# Patient Record
Sex: Male | Born: 1945 | Race: Black or African American | Hispanic: No | Marital: Single | Smoking: Former smoker
Health system: Southern US, Community
[De-identification: ages and names within clinical notes are randomized; demographics above are authoritative.]

## PROBLEM LIST (undated history)

## (undated) DIAGNOSIS — J449 Chronic obstructive pulmonary disease, unspecified: Secondary | ICD-10-CM

## (undated) HISTORY — PX: CATARACT EXTRACTION: SUR2

---

## 2011-04-18 ENCOUNTER — Emergency Department (HOSPITAL_COMMUNITY): Payer: Self-pay

## 2011-04-18 ENCOUNTER — Emergency Department (HOSPITAL_COMMUNITY)
Admission: EM | Admit: 2011-04-18 | Discharge: 2011-04-18 | Disposition: A | Discharge status: Home / Self Care | Payer: Self-pay | Attending: Emergency Medicine | Admitting: Emergency Medicine

## 2011-04-18 ENCOUNTER — Encounter: Payer: Self-pay | Admitting: *Deleted

## 2011-04-18 ENCOUNTER — Other Ambulatory Visit: Payer: Self-pay

## 2011-04-18 DIAGNOSIS — R0602 Shortness of breath: Secondary | ICD-10-CM | POA: Insufficient documentation

## 2011-04-18 DIAGNOSIS — R062 Wheezing: Secondary | ICD-10-CM

## 2011-04-18 DIAGNOSIS — J45909 Unspecified asthma, uncomplicated: Secondary | ICD-10-CM | POA: Insufficient documentation

## 2011-04-18 LAB — GLUCOSE, CAPILLARY: Glucose-Capillary: 84 mg/dL (ref 70–99)

## 2011-04-18 MED ORDER — ALBUTEROL SULFATE (5 MG/ML) 0.5% IN NEBU
2.5000 mg | INHALATION_SOLUTION | Freq: Once | RESPIRATORY_TRACT | Status: AC
  Administered 2011-04-18: 2.5 mg via RESPIRATORY_TRACT
  Filled 2011-04-18: qty 1

## 2011-04-18 MED ORDER — AEROCHAMBER PLUS W/MASK MISC
Status: AC
  Administered 2011-04-18: 17:00:00
  Filled 2011-04-18: qty 1

## 2011-04-18 MED ORDER — PREDNISONE 20 MG PO TABS: 60.0000 mg | ORAL_TABLET | Freq: Every day | ORAL | Status: AC

## 2011-04-18 MED ORDER — AEROCHAMBER PLUS W/MASK MISC
Status: AC
  Filled 2011-04-18: qty 1

## 2011-04-18 MED ORDER — IPRATROPIUM BROMIDE 0.02 % IN SOLN
0.5000 mg | Freq: Once | RESPIRATORY_TRACT | Status: AC
  Administered 2011-04-18: 0.5 mg via RESPIRATORY_TRACT
  Filled 2011-04-18 (×2): qty 2.5

## 2011-04-18 MED ORDER — PREDNISONE 20 MG PO TABS
50.0000 mg | ORAL_TABLET | Freq: Once | ORAL | Status: AC
  Administered 2011-04-18: 50 mg via ORAL
  Filled 2011-04-18: qty 3

## 2011-04-18 MED ORDER — ALBUTEROL SULFATE HFA 108 (90 BASE) MCG/ACT IN AERS
4.0000 | INHALATION_SPRAY | Freq: Once | RESPIRATORY_TRACT | Status: AC
  Administered 2011-04-18: 4 via RESPIRATORY_TRACT
  Filled 2011-04-18 (×2): qty 6.7

## 2011-04-18 MED ORDER — ALBUTEROL SULFATE HFA 108 (90 BASE) MCG/ACT IN AERS: 4.0000 | INHALATION_SPRAY | RESPIRATORY_TRACT | Status: DC

## 2011-04-18 NOTE — Progress Notes (Signed)
65 year old male comes in because of an episode of dyspnea which woke him up this morning. He has problems with dyspnea intermittently and has had problems for a long time. He states it seems to be getting worse. When he has a flare up, he is unable to laydown, unable to walk, unable to sleep. He has chest tightness, but no chest pain, no coughing, no vomiting, no diaphoresis. I examined him after he had already received one albuterol nebulizer treatment, and he still is having inspiratory and expiratory wheezing. He will need additional albuterol, and will need chest x-ray and ECG since he states he has not seen a physician for approximately 30 years.

## 2011-04-18 NOTE — ED Notes (Signed)
To ed for eval of sob. States he has a hx of asthma but not under the care of a md. States he has had intermittent sob but it seems to resolve on its own. Pt speaking phrases but with breaks in sentences for breath. Skin w/d. Denies fevers.

## 2011-04-18 NOTE — ED Provider Notes (Signed)
History     CSN: [id number] Arrival date & time: 04/18/2011  2:15 PM   First MD Initiated Contact with Patient 04/18/11 1456      Chief Complaint  Patient presents with  . Shortness of Breath    (Consider location/radiation/quality/duration/timing/severity/associated sxs/prior treatment) Patient is a 65 y.o. male presenting with shortness of breath. The history is provided by the patient.  Shortness of Breath  The current episode started yesterday. The problem occurs occasionally. The problem has been gradually worsening. The problem is moderate. The symptoms are relieved by rest (used to be relieved by rest, but has not been today). Associated symptoms include shortness of breath and wheezing. Pertinent negatives include no chest pain, no fever and no cough. There was no intake of a foreign body. He has had no prior steroid use. He has had prior intubations (pt states that he had worse episode in the past and passed out; waking up intubated). He has received no recent medical care.    Past Medical History  Diagnosis Date  . Asthma     History reviewed. No pertinent past surgical history.  History reviewed. No pertinent family history.  History  Substance Use Topics  . Smoking status: Not on file  . Smokeless tobacco: Not on file  . Alcohol Use: No      Review of Systems  Constitutional: Negative for fever.  Respiratory: Positive for shortness of breath and wheezing. Negative for cough.   Cardiovascular: Negative for chest pain.  Gastrointestinal: Negative for nausea, vomiting, abdominal pain and diarrhea.  Genitourinary: Positive for frequency.  Neurological: Negative for headaches.  All other systems reviewed and are negative.    Allergies  Review of patient's allergies indicates no known allergies.  Home Medications  No current outpatient prescriptions on file.  BP 173/104  Pulse 95  Temp(Src) 98.5 F (36.9 C) (Oral)  Resp 22  SpO2 97%  Physical Exam    Nursing note and vitals reviewed. Constitutional: He is oriented to person, place, and time. He appears well-developed and well-nourished. He appears distressed.  HENT:  Head: Normocephalic and atraumatic.  Eyes: Pupils are equal, round, and reactive to light.  Cardiovascular: Normal rate and normal heart sounds.   Pulmonary/Chest: Accessory muscle usage present. He is in respiratory distress (talking in short phrases). He has wheezes (both inspiratory and expiratory) in the right upper field, the right middle field, the right lower field, the left upper field, the left middle field and the left lower field.  Abdominal: Soft. He exhibits no distension. There is no tenderness.  Musculoskeletal: Normal range of motion.  Neurological: He is alert and oriented to person, place, and time.  Skin: Skin is warm and dry.  Psychiatric: He has a normal mood and affect.    ED Course  Procedures (including critical care time)  Dg Chest 2 View  04/18/2011  *RADIOLOGY REPORT*  Clinical Data: Wheezing and hypoxia  CHEST - 2 VIEW  Comparison: None.  Findings: Cardiomediastinal silhouette appears normal. Hyperexpansion of the lungs is noted consistent with chronic obstructive pulmonary disease.  No acute pulmonary abnormality is seen.  IMPRESSION: No acute cardiopulmonary abnormality seen.  Original Report Authenticated By: Venita Sheffield., M.D.   Results for orders placed during the hospital encounter of 04/18/11  GLUCOSE, CAPILLARY      Component Value Range   Glucose-Capillary 84  70 - 99 (mg/dL)   Comment 1 Notify RN     Comment 2 Documented in Chart  Date: 04/18/2011  Rate: 88  Rhythm: normal sinus rhythm  QRS Axis: normal  Intervals: normal  ST/T Wave abnormalities: normal  Conduction Disutrbances:none  Narrative Interpretation:   Old EKG Reviewed: none available    1. Wheezing       MDM  3:08 PM Pt seen and examined. Pt with history of childhood asthma has been without  medication since that time. Patient with intermittent episodes of SOB. This one has been going on for several days. Patient has not tried albuterol since childhood, instead using herbal supplements. Will start breathing treatments and give steroids. As patient has not seen doctor for long period of time and is growing older, will also check EKG (ACS could be another cause of shortness of breath), CXR (to ensure no pulmonary edema as cause for diuresis) and CBG (concern for DM given increased urination).   4:31 PM On re-exam, patient able to talk in long sentences and diffuse wheezing as resolved. Patient states he feels much improved. Will advise continued use of albuterol and prednisone for the next several days. Labs, CXR, and EKG unremarkable. Will treat as episode of wheezing and advise [facility].  Daleen Bo 04/19/11 [id number]

## 2011-04-20 NOTE — ED Provider Notes (Signed)
I saw and evaluated the patient, reviewed the resident's note and I agree with the findings and plan. Please see separate progress note for details of my personal interaction with the patient. I have reviewed the resident's interpretation of the ECG, and agree with it.  Dione Booze, MD 04/20/11 [phone]

## 2011-07-21 ENCOUNTER — Emergency Department (HOSPITAL_COMMUNITY)
Admission: EM | Admit: 2011-07-21 | Discharge: 2011-07-21 | Disposition: A | Discharge status: Home / Self Care | Payer: Self-pay | Attending: Emergency Medicine | Admitting: Emergency Medicine

## 2011-07-21 ENCOUNTER — Encounter (HOSPITAL_COMMUNITY): Payer: Self-pay | Admitting: Emergency Medicine

## 2011-07-21 ENCOUNTER — Emergency Department (HOSPITAL_COMMUNITY): Payer: Self-pay

## 2011-07-21 DIAGNOSIS — J45909 Unspecified asthma, uncomplicated: Secondary | ICD-10-CM | POA: Insufficient documentation

## 2011-07-21 DIAGNOSIS — R0602 Shortness of breath: Secondary | ICD-10-CM | POA: Insufficient documentation

## 2011-07-21 DIAGNOSIS — J9801 Acute bronchospasm: Secondary | ICD-10-CM

## 2011-07-21 DIAGNOSIS — R05 Cough: Secondary | ICD-10-CM | POA: Insufficient documentation

## 2011-07-21 DIAGNOSIS — R059 Cough, unspecified: Secondary | ICD-10-CM | POA: Insufficient documentation

## 2011-07-21 MED ORDER — ALBUTEROL SULFATE HFA 108 (90 BASE) MCG/ACT IN AERS: 2.0000 | INHALATION_SPRAY | RESPIRATORY_TRACT | Status: DC | PRN

## 2011-07-21 MED ORDER — PREDNISONE 10 MG PO TABS: 20.0000 mg | ORAL_TABLET | Freq: Every day | ORAL | Status: DC

## 2011-07-21 MED ORDER — IPRATROPIUM BROMIDE 0.02 % IN SOLN
0.5000 mg | Freq: Once | RESPIRATORY_TRACT | Status: AC
  Administered 2011-07-21: 0.5 mg via RESPIRATORY_TRACT
  Filled 2011-07-21: qty 2.5

## 2011-07-21 MED ORDER — ALBUTEROL SULFATE (5 MG/ML) 0.5% IN NEBU
5.0000 mg | INHALATION_SOLUTION | Freq: Once | RESPIRATORY_TRACT | Status: AC
  Administered 2011-07-21: 5 mg via RESPIRATORY_TRACT
  Filled 2011-07-21: qty 1

## 2011-07-21 MED ORDER — PREDNISONE 20 MG PO TABS
60.0000 mg | ORAL_TABLET | Freq: Once | ORAL | Status: AC
  Administered 2011-07-21: 60 mg via ORAL
  Filled 2011-07-21: qty 3

## 2011-07-21 MED ORDER — ALBUTEROL SULFATE HFA 108 (90 BASE) MCG/ACT IN AERS
2.0000 | INHALATION_SPRAY | RESPIRATORY_TRACT | Status: DC | PRN
  Administered 2011-07-21: 2 via RESPIRATORY_TRACT
  Filled 2011-07-21: qty 6.7

## 2011-07-21 NOTE — ED Provider Notes (Signed)
History     CSN: [id number]  Arrival date & time 07/21/11  1327   First MD Initiated Contact with Patient 07/21/11 1407      Chief Complaint  Patient presents with  . Shortness of Breath    (Consider location/radiation/quality/duration/timing/severity/associated sxs/prior treatment) Patient is a 66 y.o. male presenting with shortness of breath. The history is provided by the patient (The patient complains of shortness of breath and wheezing.). No language interpreter was used.  Shortness of Breath  The current episode started today. The problem occurs rarely. The problem has been unchanged. The problem is mild. The symptoms are relieved by nothing. The symptoms are aggravated by nothing. Associated symptoms include shortness of breath and wheezing. Pertinent negatives include no chest pain and no cough. The cough has no precipitants. The cough is non-productive. Nothing relieves the cough. Nothing worsens the cough. He has had intermittent steroid use.    Past Medical History  Diagnosis Date  . Asthma     History reviewed. No pertinent past surgical history.  No family history on file.  History  Substance Use Topics  . Smoking status: Former Games developer  . Smokeless tobacco: Not on file  . Alcohol Use: No      Review of Systems  Constitutional: Negative for fatigue.  HENT: Negative for congestion, sinus pressure and ear discharge.   Eyes: Negative for discharge.  Respiratory: Positive for shortness of breath and wheezing. Negative for cough.   Cardiovascular: Negative for chest pain.  Gastrointestinal: Negative for abdominal pain and diarrhea.  Genitourinary: Negative for frequency and hematuria.  Musculoskeletal: Negative for back pain.  Skin: Negative for rash.  Neurological: Negative for seizures and headaches.  Hematological: Negative.   Psychiatric/Behavioral: Negative for hallucinations.    Allergies  Review of patient's allergies indicates no known  allergies.  Home Medications   Current Outpatient Rx  Name Route Sig Dispense Refill  . ALBUTEROL SULFATE HFA 108 (90 BASE) MCG/ACT IN AERS Inhalation Inhale 2 puffs into the lungs every 4 (four) hours as needed for wheezing. 1 Inhaler 0  . PREDNISONE 10 MG PO TABS Oral Take 2 tablets (20 mg total) by mouth daily. 10 tablet 0    BP 113/79  Pulse 110  Temp(Src) 98.1 F (36.7 C) (Oral)  Resp 16  SpO2 100%  Physical Exam  Constitutional: He is oriented to person, place, and time. He appears well-developed.  HENT:  Head: Normocephalic and atraumatic.  Eyes: Conjunctivae and EOM are normal. No scleral icterus.  Neck: Neck supple. No thyromegaly present.  Cardiovascular: Normal rate and regular rhythm.  Exam reveals no gallop and no friction rub.   No murmur heard. Pulmonary/Chest: No stridor. He has wheezes. He has no rales. He exhibits no tenderness.  Abdominal: He exhibits no distension. There is no tenderness. There is no rebound.  Musculoskeletal: Normal range of motion. He exhibits no edema.  Lymphadenopathy:    He has no cervical adenopathy.  Neurological: He is oriented to person, place, and time. Coordination normal.  Skin: No rash noted. No erythema.  Psychiatric: He has a normal mood and affect. His behavior is normal.    ED Course  Procedures (including critical care time)  Labs Reviewed - No data to display Dg Chest 2 View  07/21/2011  *RADIOLOGY REPORT*  Clinical Data: Chest pain, shortness of breath.  CHEST - 2 VIEW  Comparison: 04/18/2011  Findings: There is hyperinflation of the lungs compatible with COPD.  Heart and mediastinal contours are within normal  limits.  No focal opacities or effusions.  No acute bony abnormality.  IMPRESSION: COPD.  No active disease.  Original Report Authenticated By: Cyndie Chime, M.D.     1. Bronchospasm       MDM  Bronchospasm.  Pt improved with tx        Benny Lennert, MD 07/21/11 1630

## 2011-07-21 NOTE — ED Notes (Signed)
Shortness of breath for  A couple of months ran out of meds did not get meds filled from last visit

## 2013-07-30 ENCOUNTER — Encounter (HOSPITAL_COMMUNITY): Payer: Self-pay | Admitting: Emergency Medicine

## 2013-07-30 ENCOUNTER — Emergency Department (HOSPITAL_COMMUNITY)
Admission: EM | Admit: 2013-07-30 | Discharge: 2013-07-30 | Disposition: A | Discharge status: Home / Self Care | Payer: Medicare Other | Attending: Emergency Medicine | Admitting: Emergency Medicine

## 2013-07-30 ENCOUNTER — Emergency Department (HOSPITAL_COMMUNITY): Payer: Medicare Other

## 2013-07-30 DIAGNOSIS — J4489 Other specified chronic obstructive pulmonary disease: Secondary | ICD-10-CM | POA: Diagnosis not present

## 2013-07-30 DIAGNOSIS — IMO0002 Reserved for concepts with insufficient information to code with codable children: Secondary | ICD-10-CM | POA: Diagnosis not present

## 2013-07-30 DIAGNOSIS — J441 Chronic obstructive pulmonary disease with (acute) exacerbation: Secondary | ICD-10-CM | POA: Diagnosis not present

## 2013-07-30 DIAGNOSIS — Z87891 Personal history of nicotine dependence: Secondary | ICD-10-CM | POA: Diagnosis not present

## 2013-07-30 DIAGNOSIS — I498 Other specified cardiac arrhythmias: Secondary | ICD-10-CM | POA: Diagnosis not present

## 2013-07-30 DIAGNOSIS — J45901 Unspecified asthma with (acute) exacerbation: Principal | ICD-10-CM

## 2013-07-30 LAB — CBC WITH DIFFERENTIAL/PLATELET
Basophils Absolute: 0 10*3/uL (ref 0.0–0.1)
Basophils Relative: 0 % (ref 0–1)
EOS PCT: 1 % (ref 0–5)
Eosinophils Absolute: 0.1 10*3/uL (ref 0.0–0.7)
HEMATOCRIT: 41.3 % (ref 39.0–52.0)
HEMOGLOBIN: 14.6 g/dL (ref 13.0–17.0)
LYMPHS ABS: 1.3 10*3/uL (ref 0.7–4.0)
LYMPHS PCT: 16 % (ref 12–46)
MCH: 29.9 pg (ref 26.0–34.0)
MCHC: 35.4 g/dL (ref 30.0–36.0)
MCV: 84.6 fL (ref 78.0–100.0)
MONO ABS: 0.4 10*3/uL (ref 0.1–1.0)
MONOS PCT: 5 % (ref 3–12)
Neutro Abs: 6.3 10*3/uL (ref 1.7–7.7)
Neutrophils Relative %: 78 % — ABNORMAL HIGH (ref 43–77)
Platelets: 180 10*3/uL (ref 150–400)
RBC: 4.88 MIL/uL (ref 4.22–5.81)
RDW: 13.5 % (ref 11.5–15.5)
WBC: 8.1 10*3/uL (ref 4.0–10.5)

## 2013-07-30 LAB — COMPREHENSIVE METABOLIC PANEL
ALT: 47 U/L (ref 0–53)
AST: 67 U/L — ABNORMAL HIGH (ref 0–37)
Albumin: 3.6 g/dL (ref 3.5–5.2)
Alkaline Phosphatase: 122 U/L — ABNORMAL HIGH (ref 39–117)
BUN: 12 mg/dL (ref 6–23)
CALCIUM: 9.6 mg/dL (ref 8.4–10.5)
CO2: 26 meq/L (ref 19–32)
CREATININE: 0.75 mg/dL (ref 0.50–1.35)
Chloride: 102 mEq/L (ref 96–112)
GLUCOSE: 110 mg/dL — AB (ref 70–99)
Potassium: 3.7 mEq/L (ref 3.7–5.3)
SODIUM: 143 meq/L (ref 137–147)
Total Bilirubin: 0.7 mg/dL (ref 0.3–1.2)
Total Protein: 8.1 g/dL (ref 6.0–8.3)

## 2013-07-30 LAB — PRO B NATRIURETIC PEPTIDE: Pro B Natriuretic peptide (BNP): 1213 pg/mL — ABNORMAL HIGH (ref 0–125)

## 2013-07-30 MED ORDER — ALBUTEROL SULFATE (2.5 MG/3ML) 0.083% IN NEBU: 2.5000 mg | INHALATION_SOLUTION | Freq: Once | RESPIRATORY_TRACT | Status: DC

## 2013-07-30 MED ORDER — ALBUTEROL SULFATE HFA 108 (90 BASE) MCG/ACT IN AERS
2.0000 | INHALATION_SPRAY | RESPIRATORY_TRACT | Status: DC | PRN
  Administered 2013-07-30: 2 via RESPIRATORY_TRACT
  Filled 2013-07-30: qty 6.7

## 2013-07-30 MED ORDER — IPRATROPIUM-ALBUTEROL 0.5-2.5 (3) MG/3ML IN SOLN
3.0000 mL | Freq: Once | RESPIRATORY_TRACT | Status: AC
  Administered 2013-07-30: 3 mL via RESPIRATORY_TRACT
  Filled 2013-07-30: qty 3

## 2013-07-30 MED ORDER — IPRATROPIUM BROMIDE 0.02 % IN SOLN
0.5000 mg | Freq: Once | RESPIRATORY_TRACT | Status: AC
  Administered 2013-07-30: 0.5 mg via RESPIRATORY_TRACT
  Filled 2013-07-30: qty 2.5

## 2013-07-30 MED ORDER — ALBUTEROL SULFATE (2.5 MG/3ML) 0.083% IN NEBU
5.0000 mg | INHALATION_SOLUTION | Freq: Once | RESPIRATORY_TRACT | Status: AC
  Administered 2013-07-30: 5 mg via RESPIRATORY_TRACT
  Filled 2013-07-30: qty 6

## 2013-07-30 MED ORDER — ALBUTEROL (5 MG/ML) CONTINUOUS INHALATION SOLN
15.0000 mg | INHALATION_SOLUTION | RESPIRATORY_TRACT | Status: DC
  Administered 2013-07-30: 15 mg via RESPIRATORY_TRACT
  Filled 2013-07-30: qty 20

## 2013-07-30 MED ORDER — METHYLPREDNISOLONE SODIUM SUCC 125 MG IJ SOLR
125.0000 mg | Freq: Once | INTRAMUSCULAR | Status: AC
  Administered 2013-07-30: 125 mg via INTRAVENOUS
  Filled 2013-07-30: qty 2

## 2013-07-30 MED ORDER — PREDNISONE 20 MG PO TABS: 40.0000 mg | ORAL_TABLET | Freq: Every day | ORAL | Status: DC

## 2013-07-30 NOTE — ED Notes (Signed)
Pt states that he has been SOB for the past couple of days. Pt states that he had asthma as a child and thought he grew out of it. Pt states no inhalers or nebs at home. Pt states cough as well for a couple of days.

## 2013-07-30 NOTE — ED Provider Notes (Signed)
Pt signed out to me by Marlon Peliffany Greene, PA-C at shift change.  Pt currently on 1hr neb tx.  Plan is to discharge home with steroids and rescue inhaler if feeling better. If not, pt will be admitted COPD exacerbation.  BNP is 1213, however CXR is clear.  4:35 PM Pt states he is feeling much better and feels comfortable being discharged home. Discussed tx with prednisone and rescue inhaler. Resource guide provided for PCP. Return precautions provided. Pt verbalized understanding and agreement with tx plan.    Junius Finnerrin O'Malley, PA-C 07/30/13 [phone]

## 2013-07-30 NOTE — Discharge Instructions (Signed)
°Emergency Department Resource Guide °1) Find a Doctor and Pay Out of Pocket °Although you won't have to find out who is covered by your insurance plan, it is a good idea to ask around and get recommendations. You will then need to call the office and see if the doctor you have chosen will accept you as a new patient and what types of options they offer for patients who are self-pay. Some doctors offer discounts or will set up payment plans for their patients who do not have insurance, but you will need to ask so you aren't surprised when you get to your appointment. ° °2) Contact Your Local Health Department °Not all health departments have doctors that can see patients for sick visits, but many do, so it is worth a call to see if yours does. If you don't know where your local health department is, you can check in your phone book. The CDC also has a tool to help you locate your state's health department, and many state websites also have listings of all of their local health departments. ° °3) Find a Walk-in Clinic °If your illness is not likely to be very severe or complicated, you may want to try a walk in clinic. These are popping up all over the country in pharmacies, drugstores, and shopping centers. They're usually staffed by nurse practitioners or physician assistants that have been trained to treat common illnesses and complaints. They're usually fairly quick and inexpensive. However, if you have serious medical issues or chronic medical problems, these are probably not your best option. ° °No Primary Care Doctor: °- Call [facility] Connect at  832-8000 - they can help you locate a primary care doctor that  accepts your insurance, provides certain services, etc. °- Physician Referral Service- 1-800-533-3463 ° °Chronic Pain Problems: °Organization         Address  Phone   Notes  °[facility]  (336) 297-2271 Patients need to be referred by their primary care doctor.  ° °Medication  Assistance: °Organization         Address  Phone   Notes  °[place] [facility] [address] °[place], [place] [address] (336) 641-8030 --Must be a resident of [place] °-- Must have NO insurance coverage whatsoever (no Medicaid/ Medicare, etc.) °-- The pt. MUST have a primary care doctor that directs their care regularly and follows them in the community °  °[facility]  (866) 331-1348   °[employer]  (888) 892-1162   ° °Agencies that provide inexpensive medical care: °Organization         Address  Phone   Notes  °[facility]  (336) 832-8035   ° Internal Medicine    (336) 832-7272   °[facility] [address] °[place], [place] [address] (336) 832-4777   °[facility] [address], °[place] (336) 271-4999   °[facility]    (336) 373-0678   °[facility]    (336) 272-1050   °[facility] ° [address], [place] Phone:  (336) 832-4444, Fax:  (336) 832-4440 Hours of Operation:  9 am - 6 pm, M-F.  Also accepts Medicaid/Medicare and self-pay.  °[facility] ° [address], [place] Phone: (336) 832-3150, Fax: (336) 832-3151. Hours of Operation:  8:30 am - 5:30 pm, M-F.  Also accepts Medicaid and self-pay.  °[facility] [place] [address], [place] Phone: (336) 878-6027   °[facility] [address], [place], [place] (336)723-1848, Ext. 123 Mondays & Thursdays: 7-9 AM.  First 15 patients are seen on a first come, first serve basis. °  ° °Medicaid-accepting [place] Providers: ° °Organization         Address  Phone   Notes  °[facility] [address], [place] (336) 641-2100 Also accepts self-pay patients.  °[facility] [address], [place] ° (336) 856-9996   °[facility] [address], [place]  (336) 288-8857   °[facility] [address] [place] [address], [place] (336) 299-7000   °Veita Bland [address], [place]  ° (336) 373-1557 Only accepts [place] Access Medicaid patients after they have their name applied to their card.  ° °Self-Pay (no insurance) in [place]: ° °Organization         Address  Phone   Notes  °Sickle Cell Patients, [facility] [address], [place] (336) 832-1970   °[facility] [address], [place] (336) 832-4400   °[facility] ° [address], [place] (336) 992-4800   °[facility]/Dr. Osei-Bonsu ° [address] [place] [address], [place] or [address], [place] (336) 841-8500 Phone number for both [place] and [place] locations is the same.  °[facility] [address], [place] (336) 299-0000   °[facility] [address] [place] [address], [place] or [address] (336) 852-7530 °(336) 878-2260   °[facility] [address], [place] (336) 350-1642, phone; (336) 294-5005, fax Sees patients 1st and 3rd Saturday of every month.  Must not qualify for public or private insurance (i.e. Medicaid, Medicare, [place] Health Choice, Veterans' Benefits) • Household income should be no more than 200% of the poverty level •The clinic cannot treat you if you are pregnant or think you are pregnant • Sexually transmitted diseases are not treated at the clinic.  ° ° °Dental Care: °Organization         Address  Phone  Notes  °[place] [facility] [address], [place] (336) 641-6152 Accepts children up to age 21 who are enrolled in Medicaid or [place] Health Choice; pregnant women with a Medicaid card; and children who have applied for Medicaid or [place] Health Choice, but were declined, whose parents can pay a reduced fee at time of service.  °[place]  [facility] [place]  [address], [place] (336) 641-7733 Accepts children up to age 21 who are enrolled in Medicaid or [place] Health Choice; pregnant women with a Medicaid card; and children who have applied for Medicaid or  Health Choice, but were declined, whose parents can pay a reduced fee at time of service.  °[facility] ° [address],  (336) 641-4533 Patients are seen by appointment only. Walk-ins are not accepted. [facility] will see patients 18 years of age and older. °Monday - Tuesday (8am-5pm) °Most Wednesdays (8:30-5pm) °$30 per visit, cash only  °[facility] ° [address], [place] (336) 641-4533 Patients are seen by appointment only. Walk-ins are not accepted. [facility] will see patients 18 years of age and older. °One   Wednesday Evening (Monthly: Volunteer Based).  $30 per visit, cash only  °[facility]  (919) 537-3737 for adults; Children under age 4, call Graduate Pediatric Dentistry at (919) [phone]. Children aged 4-14, please call (919) [phone] to request a pediatric application. ° Dental services are provided in all areas of dental care including fillings, crowns and bridges, complete and partial dentures, implants, gum treatment, root canals, and extractions. Preventive care is also provided. Treatment is provided to both adults and children. °Patients are selected via a lottery and there is often a waiting list. °  °[facility] [address], °[place] ° (336) 763-8833 [email or web address] °  °[facility] [address], [place], [place] (336)723-1848, Ext. 123 Second and Fourth Thursday of each month, opens at 6:30 AM; Clinic ends at 9 AM.  Patients are seen on a first-come first-served basis, and a limited number are seen during each clinic.  ° °[facility] ° [address], [place], [place] (336) 723-7904    Eligibility Requirements °You must have lived in [place], [place], or [place] counties for at least the last three months. °  You cannot be eligible for state or federal sponsored healthcare insurance, including Veterans Administration, Medicaid, or Medicare. °  You generally cannot be eligible for healthcare insurance through your employer.  °  How to apply: °Eligibility screenings are held every Tuesday and Wednesday afternoon from 1:00 pm until 4:00 pm. You do not need an appointment for the interview!  °[facility] [address], [place], [place] 336-631-2330   °[facility] [facility]  336-342-8273   °[facility]  336-703-3100   °[facility]  336-570-6415   ° °Behavioral Health Resources in the Community: °Intensive Outpatient Programs °Organization         Address  Phone  Notes  °[facility] [address], [place], [place] 336-878-6098   °[facility] [address], [place], [place] 336-832-9800   °[facility] [address], [place], [place] ° 336-882-2125   °[facility] [address],  °[place], [place] 1-800-853-5163 or 336-641-4981   °Substance Abuse Resources °Organization         Address  Phone  Notes  °[facility]  336-882-2125   °[facility]  336-784-9470   °[facility]  336-285-9073   °[facility]  336-845-3988   °Residential & Outpatient Substance Abuse Program  1-800-659-3381   °Psychological Services °Organization         Address  Phone  Notes  °[facility]  336- 832-9600   °[facility]  336- 378-7881   °[facility] [address], [place] 1-800-853-5163 or 336-641-4981   ° °Mobile Crisis Teams °Organization         Address  Phone  Notes  °[facility], Mobile Crisis Care Unit  1-877-626-1772   °Assertive °[facility] ° [address]  [place], [place] 336-834-9664   °Sharon DeEsch [address] °  336-554-5454   ° °Self-Help/Support Groups °Organization         Address  Phone             Notes  °[facility]  - variety of support groups  336- 373-1402 Call for more information  °Narcotics Anonymous (NA), [facility] [address], °[place]   2 meetings at this location  ° °  Residential Treatment Programs °Organization         Address  Phone  Notes  °[facility] [address],    °[place]  1-866-801-8205   °[facility] ° [address], [place], [place] 704-293-8524   °[facility] [address], [place] 336-845-3988 Admissions: 8am-3pm M-F  °[facility] [address]    °[place], [place] 336-841-1104   °[facility] [address], [place], [place] 336-379-7146   °[facility] [address]  °[place], [place] 336-285-9073   °[facility] Intensive Outpatient [address], [place], [place] 336-852-3033   °[facility] [address]  °[place], [place] 1-877-615-2722 or 336-784-9470   °[facility] [address] [place], [place] 336-227-7417 Accepts Medicaid  °[facility] [address]  °[place] 1-800-659-3381 Substance Abuse/Addiction Treatment  ° °[place] Behavioral Health Resources °Organization         Address  Phone  Notes  °[facility]  (888) 581-9988   °Julie Brannon, PhD [address] [place], [place]   (336) 349-5553 or (336) 951-0000   °[facility]   [address] °[place], [place] (336) 349-4454   °[facility] [address], [place], [place] (336) 342-8316 Insurance/Medicaid/sponsorship through [facility]  °[facility] [address]                                    [place], [place] (336) 342-8316 Therapy/tele-psych/case    °[facility] [address]  ° [place], [place] (336) 349-2233    °Dr. Arfeen  (336) 349-4544   °[facility] [place]  [employer] [place] [facility] 1) [address], [place] °2) [address], [place] °3)  [address], [place] (336) 349-3220 °(336) 342-7768 ° °(336) 342-8140   °[place] Child Abuse [facility]line (336) 342-1394 or (336) 342-3537 (After Hours)    ° ° °

## 2013-07-30 NOTE — ED Notes (Signed)
Breathing has improved greatly with hour long neb tx. Pt states that it feels easier to breath. Oxygen Sats 100% Resp 12.

## 2013-07-30 NOTE — ED Notes (Signed)
MD at bedside. 

## 2013-07-30 NOTE — ED Provider Notes (Signed)
Medical screening examination/treatment/procedure(s) were conducted as a shared visit with non-physician practitioner(s) and myself.  I personally evaluated the patient during the encounter.  EKG Interpretation    Date/Time:  Wednesday July 30 2013 12:10:56 EST Ventricular Rate:  101 PR Interval:  130 QRS Duration: 84 QT Interval:  394 QTC Calculation: 510 R Axis:   84 Text Interpretation:  Sinus tachycardia RSR' or QR pattern in V1 suggests right ventricular conduction delay Septal infarct , age undetermined Abnormal ECG No significant change since November 2012 Confirmed by Telly Jawad  DO, Teagan Heidrick ([id number]) on 07/30/2013 12:18:50 PM            Pt is a 68 y.o. M with a prior history of asthma and tobacco use who presents to the emergency department with wheezing for several days. He denies that he has ever been diagnosed with COPD but states he has had chest x-rays in the past that showed emphysema. No recent infectious symptoms. On exam, patient is tachypneic but has no increased work of breathing, no respiratory distress, no hypoxia. Will give breathing treatments, steroids, obtain chest x-ray. Anticipate if patient is feeling better he can be discharged home.  Victor MawKristen N Yarielis Funaro, DO 07/30/13 1301

## 2013-07-30 NOTE — ED Provider Notes (Signed)
CSN: [id number]     Arrival date & time 07/30/13  1203 History   First MD Initiated Contact with Patient 07/30/13 1220     Chief Complaint  Patient presents with  . Asthma     (Consider location/radiation/quality/duration/timing/severity/associated sxs/prior Treatment) HPI  Patient to the ER with complaints of asthma exacerbation.  He has a history of asthma and tobacco use. He reports wheezing for the past few days. Does not have  COPD but does endorse having some emphysema changes on chest xray in the past. He denies having a rescue inhaler at home. He is able to talk to full sentences and denies having cough, fevers, weakness, nausea, vomiting or diarrhea.   Past Medical History  Diagnosis Date  . Asthma    History reviewed. No pertinent past surgical history. History reviewed. No pertinent family history. History  Substance Use Topics  . Smoking status: Former Games developermoker  . Smokeless tobacco: Not on file  . Alcohol Use: No    Review of Systems The patient denies anorexia, fever, weight loss,, vision loss, decreased hearing, hoarseness, chest pain, syncope, dyspnea on exertion, peripheral edema, balance deficits, hemoptysis, abdominal pain, melena, hematochezia, severe indigestion/heartburn, hematuria, incontinence, genital sores, muscle weakness, suspicious skin lesions, transient blindness, difficulty walking, depression, unusual weight change, abnormal bleeding, enlarged lymph nodes, angioedema, and breast masses.    Allergies  Review of patient's allergies indicates no known allergies.  Home Medications   Current Outpatient Rx  Name  Route  Sig  Dispense  Refill  . EXPIRED: albuterol (PROVENTIL HFA;VENTOLIN HFA) 108 (90 BASE) MCG/ACT inhaler   Inhalation   Inhale 2 puffs into the lungs every 4 (four) hours as needed for wheezing.   1 Inhaler   0   . predniSONE (DELTASONE) 10 MG tablet   Oral   Take 2 tablets (20 mg total) by mouth daily.   10 tablet   0    BP  137/80  Pulse 89  Temp(Src) 98.2 F (36.8 C) (Oral)  Resp 18  SpO2 98% Physical Exam  Nursing note and vitals reviewed. Constitutional: He is oriented to person, place, and time. He appears well-developed and well-nourished. No distress.  HENT:  Head: Normocephalic and atraumatic.  Eyes: Pupils are equal, round, and reactive to light.  Neck: Normal range of motion. Neck supple.  Cardiovascular: Normal rate and regular rhythm.   Pulmonary/Chest: Effort normal. He has wheezes (diffuse inspiratory and expiratory wheezing). He has no rales.  Abdominal: Soft.  Neurological: He is alert and oriented to person, place, and time.  Skin: Skin is warm and dry.      ED Course  Procedures (including critical care time) Labs Review Labs Reviewed  CBC WITH DIFFERENTIAL - Abnormal; Notable for the following:    Neutrophils Relative % 78 (*)    All other components within normal limits  COMPREHENSIVE METABOLIC PANEL - Abnormal; Notable for the following:    Glucose, Bld 110 (*)    AST 67 (*)    Alkaline Phosphatase 122 (*)    All other components within normal limits  PRO B NATRIURETIC PEPTIDE - Abnormal; Notable for the following:    Pro B Natriuretic peptide (BNP) 1213.0 (*)    All other components within normal limits   Imaging Review No results found.  EKG Interpretation    Date/Time:  Wednesday July 30 2013 12:10:56 EST Ventricular Rate:  101 PR Interval:  130 QRS Duration: 84 QT Interval:  394 QTC Calculation: 510 R Axis:  84 Text Interpretation:  Sinus tachycardia RSR' or QR pattern in V1 suggests right ventricular conduction delay Septal infarct , age undetermined Abnormal ECG No significant change since November 2012 Confirmed by WARD  DO, KRISTEN ([id number]) on 07/30/2013 12:18:50 PM            MDM   Final diagnoses:  None   CRITICAL CARE Performed by: Dorthula Matas Total critical care time: 30 Critical care time was exclusive of separately billable  procedures and treating other patients. Critical care was necessary to treat or prevent imminent or life-threatening deterioration. Critical care was time spent personally by me on the following activities: development of treatment plan with patient and/or surrogate as well as nursing, discussions with consultants, evaluation of patient's response to treatment, examination of patient, obtaining history from patient or surrogate, ordering and performing treatments and interventions, ordering and review of laboratory studies, ordering and review of radiographic studies, pulse oximetry and re-evaluation of patient's condition.   Dr. Elesa Massed has seen patient as well. Will get multiple Duo Neb treatments and has received 125 mg IV solumedrol. Patient has received 2 breathing treatment and steroids he still has increased work rate of breathing.    2:58 pm At end of shift, patient doing hour long Neb with 15 mL of Albuterol and Atrovent in it. Will hand off to Junius Finner, if patient continues to have increased effort of breathing then I recommend admission.  Dorthula Matas, PA-C 07/30/13 1501

## 2013-10-06 DIAGNOSIS — R0602 Shortness of breath: Secondary | ICD-10-CM | POA: Diagnosis not present

## 2013-10-06 DIAGNOSIS — Z23 Encounter for immunization: Secondary | ICD-10-CM | POA: Diagnosis not present

## 2013-10-06 DIAGNOSIS — J45909 Unspecified asthma, uncomplicated: Secondary | ICD-10-CM | POA: Diagnosis not present

## 2013-10-06 DIAGNOSIS — Z136 Encounter for screening for cardiovascular disorders: Secondary | ICD-10-CM | POA: Diagnosis not present

## 2013-10-06 DIAGNOSIS — Z125 Encounter for screening for malignant neoplasm of prostate: Secondary | ICD-10-CM | POA: Diagnosis not present

## 2013-10-23 DIAGNOSIS — H269 Unspecified cataract: Secondary | ICD-10-CM | POA: Diagnosis not present

## 2013-10-23 DIAGNOSIS — J45909 Unspecified asthma, uncomplicated: Secondary | ICD-10-CM | POA: Diagnosis not present

## 2013-10-23 DIAGNOSIS — Z23 Encounter for immunization: Secondary | ICD-10-CM | POA: Diagnosis not present

## 2013-10-23 DIAGNOSIS — Z1211 Encounter for screening for malignant neoplasm of colon: Secondary | ICD-10-CM | POA: Diagnosis not present

## 2013-10-31 DIAGNOSIS — H25019 Cortical age-related cataract, unspecified eye: Secondary | ICD-10-CM | POA: Diagnosis not present

## 2013-10-31 DIAGNOSIS — H2510 Age-related nuclear cataract, unspecified eye: Secondary | ICD-10-CM | POA: Diagnosis not present

## 2013-10-31 DIAGNOSIS — H25049 Posterior subcapsular polar age-related cataract, unspecified eye: Secondary | ICD-10-CM | POA: Diagnosis not present

## 2013-10-31 DIAGNOSIS — H524 Presbyopia: Secondary | ICD-10-CM | POA: Diagnosis not present

## 2013-11-25 DIAGNOSIS — IMO0002 Reserved for concepts with insufficient information to code with codable children: Secondary | ICD-10-CM | POA: Diagnosis not present

## 2013-12-16 ENCOUNTER — Encounter (HOSPITAL_COMMUNITY): Payer: Self-pay | Admitting: Emergency Medicine

## 2013-12-16 ENCOUNTER — Emergency Department (HOSPITAL_COMMUNITY)
Admission: EM | Admit: 2013-12-16 | Discharge: 2013-12-16 | Disposition: A | Discharge status: Home / Self Care | Payer: Medicare Other | Attending: Emergency Medicine | Admitting: Emergency Medicine

## 2013-12-16 DIAGNOSIS — J449 Chronic obstructive pulmonary disease, unspecified: Secondary | ICD-10-CM | POA: Diagnosis not present

## 2013-12-16 DIAGNOSIS — K089 Disorder of teeth and supporting structures, unspecified: Secondary | ICD-10-CM | POA: Diagnosis not present

## 2013-12-16 DIAGNOSIS — Z79899 Other long term (current) drug therapy: Secondary | ICD-10-CM | POA: Diagnosis not present

## 2013-12-16 DIAGNOSIS — J4489 Other specified chronic obstructive pulmonary disease: Secondary | ICD-10-CM | POA: Insufficient documentation

## 2013-12-16 DIAGNOSIS — Z87891 Personal history of nicotine dependence: Secondary | ICD-10-CM | POA: Diagnosis not present

## 2013-12-16 DIAGNOSIS — IMO0002 Reserved for concepts with insufficient information to code with codable children: Secondary | ICD-10-CM | POA: Diagnosis not present

## 2013-12-16 DIAGNOSIS — K0889 Other specified disorders of teeth and supporting structures: Secondary | ICD-10-CM | POA: Diagnosis not present

## 2013-12-16 HISTORY — DX: Chronic obstructive pulmonary disease, unspecified: J44.9

## 2013-12-16 MED ORDER — TRAMADOL HCL 50 MG PO TABS: 50.0000 mg | ORAL_TABLET | Freq: Four times a day (QID) | ORAL | Status: DC | PRN

## 2013-12-16 MED ORDER — PENICILLIN V POTASSIUM 500 MG PO TABS: 500.0000 mg | ORAL_TABLET | Freq: Four times a day (QID) | ORAL | Status: DC

## 2013-12-16 NOTE — Discharge Instructions (Signed)
Take Veetid as directed until gone. Take Tramadol as needed for pain. Follow up with the recommended dentist.    Emergency Department Resource Guide 1) Find a Doctor and Pay Out of Pocket Although you won't have to find out who is covered by your insurance plan, it is a good idea to ask around and get recommendations. You will then need to call the office and see if the doctor you have chosen will accept you as a new patient and what types of options they offer for patients who are self-pay. Some doctors offer discounts or will set up payment plans for their patients who do not have insurance, but you will need to ask so you aren't surprised when you get to your appointment.  2) Contact Your Local Health Department Not all health departments have doctors that can see patients for sick visits, but many do, so it is worth a call to see if yours does. If you don't know where your local health department is, you can check in your phone book. The CDC also has a tool to help you locate your state's health department, and many state websites also have listings of all of their local health departments.  3) Find a Walk-in Clinic If your illness is not likely to be very severe or complicated, you may want to try a walk in clinic. These are popping up all over the country in pharmacies, drugstores, and shopping centers. They're usually staffed by nurse practitioners or physician assistants that have been trained to treat common illnesses and complaints. They're usually fairly quick and inexpensive. However, if you have serious medical issues or chronic medical problems, these are probably not your best option.  No Primary Care Doctor: - Call Health Connect at  [phone] - they can help you locate a primary care doctor that  accepts your insurance, provides certain services, etc. - Physician Referral Service- [phone]  Chronic Pain Problems: Organization         Address  Phone   Notes  [facility]  [phone] Patients need to be referred by their primary care doctor.   Medication Assistance: Organization         Address  Phone   Notes  [facility] [address] [place], [place] [address] [phone] --Must be a resident of [facility] -- Must have NO insurance coverage whatsoever (no Medicaid/ Medicare, etc.) -- The pt. MUST have a primary care doctor that directs their care regularly and follows them in the community   Med[facility]ist  [phone]   [employer]  [phone]    Agencies that provide inexpensive medical care: Organization         Address  Phone   Notes  [facility]  [phone]   [facility]    [phone]   [facility] [address] [place], [place] [address] [phone]   [facility] [address] [place] [phone]   Planned Parenthood    [phone]   [facility]    [phone]   [facility] and [facility]  [address], [place] Phone:  [phone], Fax:  [phone] Hours of Operation:  9 am - 6 pm, M-F.  Also accepts Medicaid/Medicare and self-pay.  [facility]  [address], [place] Phone: [phone], Fax: [phone].  Hours of Operation:  8:30 am - 5:30 pm, M-F.  Also accepts Medicaid and self-pay.  [facility] [place] [address], [place] Phone: [phone]) [phone]   [facility] [address], [place], [place] [phone], Ext. [phone] Mondays & Thursdays: 7-9 AM.  First 15 patients are seen on a first come, first serve basis.    Medicaid-accepting [facility] Providers:  Organization         Address  Phone   Notes  [facility] [address], [place] [phone] Also accepts self-pay patients.  [facility] [address], [place]  [phone]   [facility] [address], [address], [place] [phone]) [phone]   [facility] [address] [place] [address], [place] [phone]) [phone]   Renaye RakersVeita Bland [address], [place]   [phone] Only accepts [place] Access [place]icaid patients after they have their name applied to their card.   Self-Pay (no insurance) in [facility]:  Organization         Address  Phone   Notes  Sickle Cell Patients, [facility] [address], [place] [phone](336) [phone]   [facility] [address], [place] [phone](336) [phone]   [facility]  [address], [place] [phone]) [phone]   [facility]/Dr. Osei-Bonsu  [address] [place] [address], [place] or [phone], [place] [phone] Phone number for both [place] and [place] locations is the same.  [facility] [address], [place] [phone]) [phone]   [facility] [address] [place] [address], [place] or [address] [phone]) [phone]   [facility] [address], [place] [phone]) [phone], phone; [phone]) [phone], fax Sees patients 1st and 3rd Saturday of every month.  Must not qualify for public or private insurance (i.e. Medicaid, Medicare, [place] Health Choice, Veterans' Benefits)  Household income should be no more than 200% of the poverty level The clinic cannot treat you if you are pregnant or think you are pregnant  Sexually transmitted diseases are not treated at the clinic.    Dental Care: Organization         Address  Phone  Notes  [facility] Department of [facility] [address], [place] [phone]) [phone] Accepts children up to age 67 who are enrolled in [place]icaid or [place] Health Choice; pregnant women with a Medicaid card; and children who have applied for Medicaid  or [place] Health Choice, but were declined, whose parents can pay a reduced fee at time of service.  [facility] Department of [facility] [place]  [address], [place] [phone] Accepts children up to age 67 who are enrolled in [place]icaid or [employer] Health Choice; pregnant women with a Medicaid card; and children who have applied for Medicaid or [place] Health Choice, but were declined, whose parents can pay a reduced fee at time of service.  [facility]  [address], [place] [phone] Patients are seen by appointment only. Walk-ins are not accepted. [facility] will see patients 67 years of age and older. Monday - Tuesday (8am-5pm) Most Wednesdays (8:30-5pm) $30 per visit, cash only  [facility] [employer] GroupDental Access PROGRAM  [address], [facility] [place] [phone] Patients  are seen by appointment only. Walk-ins are not accepted. [facility] will see patients 67 years of age and older. One Wednesday Evening (Monthly: Volunteer Based).  $30 per visit, cash only  [employer] [facility]  [phone]) [phone] for adults; Children under age 744, call Graduate Pediatric Dentistry at [phone]) [phone]. Children aged 804-14, please call [phone]) [phone] to request a pediatric application.  Dental services are provided in all areas of dental care including fillings, crowns and bridges, complete and partial dentures, implants, gum treatment, root canals, and extractions. Preventive care is also provided. Treatment is provided to both adults and children. Patients are selected via a lottery and there is often a waiting list.   [facility] [address], [place]  [phone]) [phone] [email or web address]   [facility] [address], [place], [place] [phone], Ext. [phone] Second and Fourth Thursday of each month, opens at 6:30 AM; Clinic ends at 9 AM.  Patients are seen on a first-come first-served basis, and a limited number are seen  during each clinic.   [facility]  [address], [place], [place] [phone]) [phone]   Eligibility Requirements You must have lived in [place], [place], or [place] counties for at least the last three months.   You cannot be eligible for state or federal sponsored National Cityhealthcare insurance, including CIGNAVeterans Administration, [place]icaid, or [employer]Medicare.   You generally cannot be eligible for healthcare insurance through your employer.    How to apply: Eligibility screenings are held every Tuesday and Wednesday afternoon from 1:00 pm until 4:00 pm. You do not need an appointment for the interview!  [facility] [address], [place], [place] [phone]   [facility]  [phone]   [facility]  [phone]   [facility]  [phone]    Behavioral Health Resources in the Community: Intensive Outpatient Programs Organization         Address  Phone  Notes  [facility] [address], [place], [place] [phone]   [facility] [address], [place], [place] [phone]   [facility]: [facility] [address], [place], [place]  [phone]   [facility] [address],  [place], [place] [phone] or [phone]   Substance Abuse Resources Organization         Address  Phone  Notes  [facility] Drug Services  [phone]   [facility]  [phone]   [facility]  [phone]   [facility]  [phone]   Residential & Outpatient Substance Abuse Program  [phone]   Psychological Services Organization         Address  Phone  Notes  [facility]  [phone]- [phone]   [facility]  [phone]- [phone]   [facility] [address], [place] [phone] or [phone]    Mobile Crisis Teams Organization         Address  Phone  Notes  [facility],  Mobile Crisis Care Unit  [phone]   [facility]  [address] [place], [place] [phone]   Doristine LocksSharon DeEsch [address] [place] [phone]    Self-Help/Support Groups Organization         Address  Phone             Notes  [facility]oc[facility] of [place] - variety of support groups  336- [phone] Call  for more information  Narcotics Anonymous (NA), Caring [facility] [address], [place]  2 meetings at this location   Residential Sports administratorTreatment Programs Organization         Address  Phone  Notes  [facility] [address],    [place]  [phone]   [facility]  [address], [place] [phone], [place], [place] [phone]   [facility] [address], [place] [phone] Admissions: 8am-3pm M-F  [facility] [address]    [place], [place] [phone]   [facility] [address], [place], [place] [phone]   [facility] [address]  [place], [place] [phone]   [facility] - Intensive Outpatient [address], [place], [place] [phone]   [facility] ([facility] [address]  [place], [place] [phone] or [phone]   [facility] (RTS) [address] [place], [place] [phone] Accepts Medicaid  [facility] [address]  [place] [phone] Substance Abuse/Addiction Treatment   [facility] Behavioral Health Resources Organization         Address  Phone  Notes  [facility]  [phone]   Angie FavaJulie Brannon, PhD [address], [address] [place], [place]   [phone]) [phone] or [phone]   [facility]   [address] [place], [place] [phone](336) [phone]   [facility] [address], [place], [place] [phone]) [phone] Insurance/Medicaid/sponsorship through [facility]  Faith and Families [address]  [address]                                    [place], [place] [phone]) [phone] Therapy/tele-psych/case  [facility] [address].   [place], [place] [phone](336) [phone]    Dr. Lolly MustacheArfeen  [phone]) [phone]   [facility]  [employer] [facility] 1) [address], [place] 2) [address], [place] 3)  [address], [place] [phone](336) [phone]  [phone](336) [phone]   [facility] Child Abuse Hotline [phone]) [phone] or [phone] (After Hours)

## 2013-12-16 NOTE — ED Notes (Signed)
Pt had upper teeth pulled 4 years ago.  Dentist left one broke upper left tooth that has been giving him pain since.  Broke bottom right back molar and several loose teeth on bottom teeth.  Onset 2-3 days teeth pain keeping pt from sleeping and eating.  No fever.

## 2013-12-16 NOTE — ED Provider Notes (Signed)
CSN: [id number]     Arrival date & time 12/16/13  1233 History  This chart was scribed for non-physician practitioner, Emilia BeckKaitlyn Giavanni Odonovan, PA-C working with Raeford RazorStephen Kohut, MD by Greggory StallionKayla Andersen, ED scribe. This patient was seen in room TR09C/TR09C and the patient's care was started at 1:28 PM.   Chief Complaint  Patient presents with  . Dental Pain   The history is provided by the patient. No language interpreter was used.   HPI Comments: Victor Fitzgerald is a 68 y.o. male who presents to the Emergency Department complaining of gradual onset left upper and right lower dental pain that started 2-3 days ago. He had his upper teeth pulled 4 years ago and states the dentist left one broken tooth at the top that has caused him intermittent pain since. States he also has a broken tooth on the right lower side. Pt does not currently have a dentist.   Past Medical History  Diagnosis Date  . Asthma   . COPD (chronic obstructive pulmonary disease)    Past Surgical History  Procedure Laterality Date  . Cataract extraction Left    History reviewed. No pertinent family history. History  Substance Use Topics  . Smoking status: Former Games developermoker  . Smokeless tobacco: Not on file  . Alcohol Use: Yes     Comment: occ     Review of Systems  HENT: Positive for dental problem.   All other systems reviewed and are negative.  Allergies  Shellfish allergy  Home Medications   Prior to Admission medications   Medication Sig Start Date End Date Taking? Authorizing Provider  albuterol (PROVENTIL HFA;VENTOLIN HFA) 108 (90 BASE) MCG/ACT inhaler Inhale 2 puffs into the lungs every 6 (six) hours as needed for wheezing or shortness of breath.   Yes Historical Provider, MD  ibuprofen (ADVIL,MOTRIN) 200 MG tablet Take 400 mg by mouth every 6 (six) hours as needed for headache or mild pain.   Yes Historical Provider, MD  mometasone-formoterol (DULERA) 100-5 MCG/ACT AERO Inhale 2 puffs into the lungs 2 (two) times  daily.   Yes Historical Provider, MD  Multiple Vitamins-Minerals (MULTIVITAMIN PO) Take 1 tablet by mouth daily.   Yes Historical Provider, MD   BP 131/81  Pulse 63  Temp(Src) 99.4 F (37.4 C) (Oral)  Resp 22  SpO2 99%  Physical Exam  Nursing note and vitals reviewed. Constitutional: He is oriented to person, place, and time. He appears well-developed and well-nourished. No distress.  HENT:  Head: Normocephalic and atraumatic.  Adentulous upper jaw. Lower jaw teeth tender to percussion. Broken filling noted right posterior mouth.   Eyes: Conjunctivae and EOM are normal.  Neck: Neck supple. No tracheal deviation present.  Cardiovascular: Normal rate.   Pulmonary/Chest: Effort normal. No respiratory distress.  Musculoskeletal: Normal range of motion.  Neurological: He is alert and oriented to person, place, and time.  Skin: Skin is warm and dry.  Psychiatric: He has a normal mood and affect. His behavior is normal.    ED Course  Procedures (including critical care time)  DIAGNOSTIC STUDIES: Oxygen Saturation is 99% on RA, normal by my interpretation.    COORDINATION OF CARE: 1:30 PM-Discussed treatment plan which includes an antibiotic and pain medication with pt at bedside and pt agreed to plan. Will give pt dental referrals and advised him to follow up.   Labs Review Labs Reviewed - No data to display  Imaging Review No results found.   EKG Interpretation None      MDM  Final diagnoses:  Pain, dental    1:33 PM Patient will have veetid and tramadol for dental pain. Patient referred to Dr. Russella Dar for further evaluation. Vitals stable and patient afebrile. No signs of ludwigs angina. Patient instructed to return with worsening or concerning symptoms.   I personally performed the services described in this documentation, which was scribed in my presence. The recorded information has been reviewed and is accurate.  Emilia Beck, PA-C 12/16/13 1334

## 2013-12-18 NOTE — ED Provider Notes (Signed)
Medical screening examination/treatment/procedure(s) were performed by non-physician practitioner and as supervising physician I was immediately available for consultation/collaboration.   EKG Interpretation None       Deven Furia, MD 12/18/13 0735 

## 2013-12-23 DIAGNOSIS — H25019 Cortical age-related cataract, unspecified eye: Secondary | ICD-10-CM | POA: Diagnosis not present

## 2013-12-23 DIAGNOSIS — H2510 Age-related nuclear cataract, unspecified eye: Secondary | ICD-10-CM | POA: Diagnosis not present

## 2014-01-06 DIAGNOSIS — H2510 Age-related nuclear cataract, unspecified eye: Secondary | ICD-10-CM | POA: Diagnosis not present

## 2014-05-12 DIAGNOSIS — H40013 Open angle with borderline findings, low risk, bilateral: Secondary | ICD-10-CM | POA: Diagnosis not present

## 2020-01-15 ENCOUNTER — Emergency Department (HOSPITAL_COMMUNITY)
Admission: EM | Admit: 2020-01-15 | Discharge: 2020-01-15 | Disposition: A | Discharge status: Home / Self Care | Payer: Medicare Other | Attending: Emergency Medicine | Admitting: Emergency Medicine

## 2020-01-15 ENCOUNTER — Emergency Department (HOSPITAL_COMMUNITY): Payer: Medicare Other

## 2020-01-15 ENCOUNTER — Other Ambulatory Visit: Payer: Self-pay

## 2020-01-15 DIAGNOSIS — Z79899 Other long term (current) drug therapy: Secondary | ICD-10-CM | POA: Insufficient documentation

## 2020-01-15 DIAGNOSIS — J441 Chronic obstructive pulmonary disease with (acute) exacerbation: Secondary | ICD-10-CM

## 2020-01-15 DIAGNOSIS — Z87891 Personal history of nicotine dependence: Secondary | ICD-10-CM | POA: Diagnosis not present

## 2020-01-15 DIAGNOSIS — J45909 Unspecified asthma, uncomplicated: Secondary | ICD-10-CM | POA: Diagnosis not present

## 2020-01-15 DIAGNOSIS — Z20822 Contact with and (suspected) exposure to covid-19: Secondary | ICD-10-CM | POA: Diagnosis not present

## 2020-01-15 LAB — CBC WITH DIFFERENTIAL/PLATELET
Abs Immature Granulocytes: 0.01 10*3/uL (ref 0.00–0.07)
Basophils Absolute: 0 10*3/uL (ref 0.0–0.1)
Basophils Relative: 1 %
Eosinophils Absolute: 0.3 10*3/uL (ref 0.0–0.5)
Eosinophils Relative: 5 %
HCT: 41.3 % (ref 39.0–52.0)
Hemoglobin: 12.7 g/dL — ABNORMAL LOW (ref 13.0–17.0)
Immature Granulocytes: 0 %
Lymphocytes Relative: 10 %
Lymphs Abs: 0.6 10*3/uL — ABNORMAL LOW (ref 0.7–4.0)
MCH: 25.1 pg — ABNORMAL LOW (ref 26.0–34.0)
MCHC: 30.8 g/dL (ref 30.0–36.0)
MCV: 81.8 fL (ref 80.0–100.0)
Monocytes Absolute: 0.3 10*3/uL (ref 0.1–1.0)
Monocytes Relative: 5 %
Neutro Abs: 4.8 10*3/uL (ref 1.7–7.7)
Neutrophils Relative %: 79 %
Platelets: 68 10*3/uL — ABNORMAL LOW (ref 150–400)
RBC: 5.05 MIL/uL (ref 4.22–5.81)
RDW: 16.7 % — ABNORMAL HIGH (ref 11.5–15.5)
WBC: 6.1 10*3/uL (ref 4.0–10.5)
nRBC: 0 % (ref 0.0–0.2)

## 2020-01-15 LAB — COMPREHENSIVE METABOLIC PANEL
ALT: 16 U/L (ref 0–44)
AST: 28 U/L (ref 15–41)
Albumin: 3.6 g/dL (ref 3.5–5.0)
Alkaline Phosphatase: 107 U/L (ref 38–126)
Anion gap: 15 (ref 5–15)
BUN: 19 mg/dL (ref 8–23)
CO2: 25 mmol/L (ref 22–32)
Calcium: 9.2 mg/dL (ref 8.9–10.3)
Chloride: 101 mmol/L (ref 98–111)
Creatinine, Ser: 0.76 mg/dL (ref 0.61–1.24)
GFR calc Af Amer: >60 mL/min (ref >60)
GFR calc non Af Amer: >60 mL/min (ref >60)
Glucose, Bld: 112 mg/dL — ABNORMAL HIGH (ref 70–99)
Potassium: 3.5 mmol/L (ref 3.5–5.1)
Sodium: 141 mmol/L (ref 135–145)
Total Bilirubin: 1.1 mg/dL (ref 0.3–1.2)
Total Protein: 8.3 g/dL — ABNORMAL HIGH (ref 6.5–8.1)

## 2020-01-15 LAB — SARS CORONAVIRUS 2 BY RT PCR (HOSPITAL ORDER, PERFORMED IN ~~LOC~~ HOSPITAL LAB): SARS Coronavirus 2: NEGATIVE

## 2020-01-15 MED ORDER — ALBUTEROL SULFATE HFA 108 (90 BASE) MCG/ACT IN AERS
2.0000 | INHALATION_SPRAY | RESPIRATORY_TRACT | Status: DC | PRN
  Filled 2020-01-15: qty 6.7

## 2020-01-15 MED ORDER — PREDNISONE 20 MG PO TABS: ORAL_TABLET | ORAL | 0 refills | Status: AC

## 2020-01-15 MED ORDER — METHYLPREDNISOLONE SODIUM SUCC 125 MG IJ SOLR
125.0000 mg | Freq: Once | INTRAMUSCULAR | Status: AC
  Administered 2020-01-15: 125 mg via INTRAVENOUS
  Filled 2020-01-15: qty 2

## 2020-01-15 MED ORDER — IPRATROPIUM BROMIDE 0.02 % IN SOLN
0.5000 mg | Freq: Once | RESPIRATORY_TRACT | Status: AC
  Administered 2020-01-15: 0.5 mg via RESPIRATORY_TRACT
  Filled 2020-01-15: qty 2.5

## 2020-01-15 MED ORDER — DIPHENHYDRAMINE HCL 50 MG/ML IJ SOLN
25.0000 mg | Freq: Once | INTRAMUSCULAR | Status: AC
  Administered 2020-01-15: 25 mg via INTRAVENOUS
  Filled 2020-01-15: qty 1

## 2020-01-15 MED ORDER — DOXYCYCLINE HYCLATE 100 MG PO CAPS: 100.0000 mg | ORAL_CAPSULE | Freq: Two times a day (BID) | ORAL | 0 refills | Status: AC

## 2020-01-15 MED ORDER — ALBUTEROL SULFATE (2.5 MG/3ML) 0.083% IN NEBU
5.0000 mg | INHALATION_SOLUTION | Freq: Once | RESPIRATORY_TRACT | Status: AC
  Administered 2020-01-15: 5 mg via RESPIRATORY_TRACT
  Filled 2020-01-15: qty 6

## 2020-01-15 NOTE — ED Triage Notes (Signed)
Pt. Arrived by EMS from home A&O x4 .  Ate shrimp yesterday but started having difficulty breathing this morning. Used his home albuterol inhaler   Lungs:expiratory wheezing throughout  EMS gave 10 mg albuterol and 1 mg Atrovent.   Bp: 163/ 110- 98- 30 R- 95% room air

## 2020-01-15 NOTE — ED Notes (Signed)
Assumed care of pt at this time. Pt resting in stretcher, no s/sx of acute distress at this time.  

## 2020-01-15 NOTE — ED Notes (Signed)
Pt discharged from this ED in stable condition under care of parent. Pt interactive and acting appropriate for age. All discharge instructions and follow up care reviewed with pt parent with no further questions at this time. Discharged from this facility at this time.  

## 2020-01-15 NOTE — ED Provider Notes (Signed)
[facility]-EMERGENCY DEPT Provider Note   CSN: [id number] Arrival date & time: 01/15/20  5885     History Chief Complaint  Patient presents with  . Respiratory Distress    Victor Fitzgerald is a 74 y.o. male.  Patient has COPD and complains of shortness of breath  The history is provided by the patient.  Shortness of Breath Severity:  Moderate Onset quality:  Sudden Timing:  Constant Progression:  Worsening Chronicity:  Recurrent Context: activity   Relieved by:  Nothing Worsened by:  Nothing Ineffective treatments:  None tried Associated symptoms: no abdominal pain, no chest pain, no cough, no headaches and no rash        Past Medical History:  Diagnosis Date  . Asthma   . COPD (chronic obstructive pulmonary disease)     There are no problems to display for this patient.   Past Surgical History:  Procedure Laterality Date  . CATARACT EXTRACTION Left        No family history on file.  Social History   Tobacco Use  . Smoking status: Former Smoker  Substance Use Topics  . Alcohol use: Yes    Comment: occ   . Drug use: No    Home Medications Prior to Admission medications   Medication Sig Start Date End Date Taking? Authorizing Provider  albuterol (PROVENTIL HFA;VENTOLIN HFA) 108 (90 BASE) MCG/ACT inhaler Inhale 2 puffs into the lungs every 6 (six) hours as needed for wheezing or shortness of breath.   Yes [provider]  ibuprofen (ADVIL,MOTRIN) 200 MG tablet Take 400 mg by mouth every 6 (six) hours as needed for headache or mild pain.   Yes [provider]  doxycycline (VIBRAMYCIN) 100 MG capsule Take 1 capsule (100 mg total) by mouth 2 (two) times daily. One po bid x 7 days 01/15/20   Bethann Berkshire, MD  predniSONE (DELTASONE) 20 MG tablet 2 tabs po daily x 3 days 01/15/20   Bethann Berkshire, MD    Allergies    Shellfish allergy  Review of Systems   Review of Systems  Constitutional: Negative for appetite  change and fatigue.  HENT: Negative for congestion, ear discharge and sinus pressure.   Eyes: Negative for discharge.  Respiratory: Positive for shortness of breath. Negative for cough.   Cardiovascular: Negative for chest pain.  Gastrointestinal: Negative for abdominal pain and diarrhea.  Genitourinary: Negative for frequency and hematuria.  Musculoskeletal: Negative for back pain.  Skin: Negative for rash.  Neurological: Negative for seizures and headaches.  Psychiatric/Behavioral: Negative for hallucinations.    Physical Exam Updated Vital Signs BP (!) 144/74 (BP Location: Right Arm)   Pulse 65   Temp 98.3 F (36.8 C) (Oral)   Resp 19   Ht 6' (1.829 m)   Wt 77.1 kg   SpO2 100%   BMI 23.06 kg/m   Physical Exam Vitals and nursing note reviewed.  Constitutional:      Appearance: He is well-developed.  HENT:     Head: Normocephalic.     Nose: Nose normal.  Eyes:     General: No scleral icterus.    Conjunctiva/sclera: Conjunctivae normal.  Neck:     Thyroid: No thyromegaly.  Cardiovascular:     Rate and Rhythm: Normal rate and regular rhythm.     Heart sounds: No murmur heard.  No friction rub. No gallop.   Pulmonary:     Breath sounds: No stridor. Wheezing present. No rales.  Chest:     Chest  wall: No tenderness.  Abdominal:     General: There is no distension.     Tenderness: There is no abdominal tenderness. There is no rebound.  Musculoskeletal:        General: Normal range of motion.     Cervical back: Neck supple.  Lymphadenopathy:     Cervical: No cervical adenopathy.  Skin:    Findings: No erythema or rash.  Neurological:     Mental Status: He is oriented to person, place, and time.     Motor: No abnormal muscle tone.     Coordination: Coordination normal.  Psychiatric:        Behavior: Behavior normal.     ED Results / Procedures / Treatments   Labs (all labs ordered are listed, but only abnormal results are displayed) Labs Reviewed  CBC  WITH DIFFERENTIAL/PLATELET - Abnormal; Notable for the following components:      Result Value   Hemoglobin 12.7 (*)    MCH 25.1 (*)    RDW 16.7 (*)    Platelets 68 (*)    Lymphs Abs 0.6 (*)    All other components within normal limits  COMPREHENSIVE METABOLIC PANEL - Abnormal; Notable for the following components:   Glucose, Bld 112 (*)    Total Protein 8.3 (*)    All other components within normal limits  SARS CORONAVIRUS 2 BY RT PCR (HOSPITAL ORDER, PERFORMED IN [facility])    EKG None  Radiology DG Chest Port 1 View  Result Date: 01/15/2020 CLINICAL DATA:  Patient with shortness of breath and difficulty breathing. States allergic to shrimp. Patient recently consumed shrimp. EXAM: PORTABLE CHEST 1 VIEW COMPARISON:  07/30/2013 chest radiograph. FINDINGS: Stable cardiac and mediastinal contours. Mild tortuosity of the thoracic aorta. No large area pulmonary consolidation. No pleural effusion or pneumothorax. Suspect skin fold overlying the right hemithorax. Thoracic spine degenerative changes. IMPRESSION: No acute cardiopulmonary process. Electronically Signed   By: Annia Belt M.D.   On: 01/15/2020 08:10    Procedures Procedures (including critical care time)  Medications Ordered in ED Medications  albuterol (VENTOLIN HFA) 108 (90 Base) MCG/ACT inhaler 2 puff (has no administration in time range)  diphenhydrAMINE (BENADRYL) injection 25 mg (25 mg Intravenous Given 01/15/20 0751)  methylPREDNISolone sodium succinate (SOLU-MEDROL) 125 mg/2 mL injection 125 mg (125 mg Intravenous Given 01/15/20 0751)  albuterol (PROVENTIL) (2.5 MG/3ML) 0.083% nebulizer solution 5 mg (5 mg Nebulization Given 01/15/20 1107)  ipratropium (ATROVENT) nebulizer solution 0.5 mg (0.5 mg Nebulization Given 01/15/20 1107)    ED Course  I have reviewed the triage vital signs and the nursing notes.  Pertinent labs & imaging results that were available during my care of the patient were reviewed by  me and considered in my medical decision making (see chart for details).    MDM Rules/Calculators/A&P                          Labs and Covid test unremarkable. Patient with COPD exacerbation which has improved with neb treatment. He was discharged home prednisone doxycycline follow-up as needed       This patient presents to the ED for concern of shortness of breath this involves an extensive number of treatment options, and is a complaint that carries with it a high risk of complications and morbidity.  The differential diagnosis includes pneumonia COPD exacerbation Covid infection   Lab Tests:   I Ordered, reviewed, and interpreted labs, which included  CBC and chemistries which show mild anemia  Medicines ordered:   I ordered medication steroids and albuterol and Atrovent neb  Imaging Studies ordered:   I ordered imaging studies which included chest x-ray which shows no acute disease  I independently visualized and interpreted imaging which showed no acute disease  Additional history obtained:   Additional history obtained from records  Previous records obtained and reviewed.  Consultations Obtained:     Reevaluation:  After the interventions stated above, I reevaluated the patient and found improved  Critical Interventions:  .   Final Clinical Impression(s) / ED Diagnoses Final diagnoses:  COPD exacerbation (HCC)    Rx / DC Orders ED Discharge Orders         Ordered    predniSONE (DELTASONE) 20 MG tablet     Discontinue     01/15/20 1314    doxycycline (VIBRAMYCIN) 100 MG capsule  2 times daily     Discontinue     01/15/20 1314           Bethann Berkshire, MD 01/15/20 1318

## 2020-01-15 NOTE — Discharge Instructions (Addendum)
Follow-up with your family doctor next week for recheck. Make sure you use your inhaler every 4 hours if needed for shortness of breath

## 2020-02-16 ENCOUNTER — Other Ambulatory Visit: Payer: Self-pay

## 2020-02-16 ENCOUNTER — Inpatient Hospital Stay (HOSPITAL_COMMUNITY)
Admission: EM | Admit: 2020-02-16 | Discharge: 2020-02-24 | DRG: 208 | Disposition: A | Discharge status: Skilled Nursing Facility | Payer: Medicare Other | Attending: Internal Medicine | Admitting: Internal Medicine

## 2020-02-16 ENCOUNTER — Emergency Department (HOSPITAL_COMMUNITY): Payer: Medicare Other

## 2020-02-16 DIAGNOSIS — E872 Acidosis, unspecified: Secondary | ICD-10-CM | POA: Diagnosis present

## 2020-02-16 DIAGNOSIS — J439 Emphysema, unspecified: Secondary | ICD-10-CM | POA: Diagnosis present

## 2020-02-16 DIAGNOSIS — G9341 Metabolic encephalopathy: Secondary | ICD-10-CM | POA: Diagnosis present

## 2020-02-16 DIAGNOSIS — D61818 Other pancytopenia: Secondary | ICD-10-CM | POA: Diagnosis present

## 2020-02-16 DIAGNOSIS — I248 Other forms of acute ischemic heart disease: Secondary | ICD-10-CM | POA: Diagnosis present

## 2020-02-16 DIAGNOSIS — Z20822 Contact with and (suspected) exposure to covid-19: Secondary | ICD-10-CM | POA: Diagnosis present

## 2020-02-16 DIAGNOSIS — J9602 Acute respiratory failure with hypercapnia: Secondary | ICD-10-CM | POA: Diagnosis not present

## 2020-02-16 DIAGNOSIS — G9340 Encephalopathy, unspecified: Secondary | ICD-10-CM | POA: Diagnosis present

## 2020-02-16 DIAGNOSIS — B9620 Unspecified Escherichia coli [E. coli] as the cause of diseases classified elsewhere: Secondary | ICD-10-CM | POA: Diagnosis present

## 2020-02-16 DIAGNOSIS — K4090 Unilateral inguinal hernia, without obstruction or gangrene, not specified as recurrent: Secondary | ICD-10-CM | POA: Diagnosis present

## 2020-02-16 DIAGNOSIS — R4182 Altered mental status, unspecified: Secondary | ICD-10-CM

## 2020-02-16 DIAGNOSIS — N390 Urinary tract infection, site not specified: Secondary | ICD-10-CM | POA: Diagnosis present

## 2020-02-16 DIAGNOSIS — R778 Other specified abnormalities of plasma proteins: Secondary | ICD-10-CM | POA: Diagnosis not present

## 2020-02-16 DIAGNOSIS — B1920 Unspecified viral hepatitis C without hepatic coma: Secondary | ICD-10-CM | POA: Diagnosis present

## 2020-02-16 DIAGNOSIS — R768 Other specified abnormal immunological findings in serum: Secondary | ICD-10-CM

## 2020-02-16 DIAGNOSIS — J9622 Acute and chronic respiratory failure with hypercapnia: Secondary | ICD-10-CM | POA: Diagnosis present

## 2020-02-16 DIAGNOSIS — R1031 Right lower quadrant pain: Secondary | ICD-10-CM | POA: Diagnosis not present

## 2020-02-16 DIAGNOSIS — E876 Hypokalemia: Secondary | ICD-10-CM | POA: Diagnosis present

## 2020-02-16 DIAGNOSIS — R109 Unspecified abdominal pain: Secondary | ICD-10-CM

## 2020-02-16 DIAGNOSIS — J441 Chronic obstructive pulmonary disease with (acute) exacerbation: Secondary | ICD-10-CM | POA: Diagnosis present

## 2020-02-16 LAB — RAPID URINE DRUG SCREEN, HOSP PERFORMED
Amphetamines: NOT DETECTED
Barbiturates: NOT DETECTED
Benzodiazepines: NOT DETECTED
Cocaine: NOT DETECTED
Opiates: NOT DETECTED
Tetrahydrocannabinol: NOT DETECTED

## 2020-02-16 LAB — BASIC METABOLIC PANEL
Anion gap: 10 (ref 5–15)
BUN: 18 mg/dL (ref 8–23)
CO2: 27 mmol/L (ref 22–32)
Calcium: 8.7 mg/dL — ABNORMAL LOW (ref 8.9–10.3)
Chloride: 105 mmol/L (ref 98–111)
Creatinine, Ser: 0.87 mg/dL (ref 0.61–1.24)
GFR calc Af Amer: >60 mL/min (ref >60)
GFR calc non Af Amer: >60 mL/min (ref >60)
Glucose, Bld: 130 mg/dL — ABNORMAL HIGH (ref 70–99)
Potassium: 3.7 mmol/L (ref 3.5–5.1)
Sodium: 142 mmol/L (ref 135–145)

## 2020-02-16 LAB — CBC WITH DIFFERENTIAL/PLATELET
Abs Immature Granulocytes: 0.01 10*3/uL (ref 0.00–0.07)
Basophils Absolute: 0 10*3/uL (ref 0.0–0.1)
Basophils Relative: 1 %
Eosinophils Absolute: 0.1 10*3/uL (ref 0.0–0.5)
Eosinophils Relative: 4 %
HCT: 43.1 % (ref 39.0–52.0)
Hemoglobin: 12.5 g/dL — ABNORMAL LOW (ref 13.0–17.0)
Immature Granulocytes: 0 %
Lymphocytes Relative: 25 %
Lymphs Abs: 0.8 10*3/uL (ref 0.7–4.0)
MCH: 25 pg — ABNORMAL LOW (ref 26.0–34.0)
MCHC: 29 g/dL — ABNORMAL LOW (ref 30.0–36.0)
MCV: 86 fL (ref 80.0–100.0)
Monocytes Absolute: 0.2 10*3/uL (ref 0.1–1.0)
Monocytes Relative: 6 %
Neutro Abs: 2.1 10*3/uL (ref 1.7–7.7)
Neutrophils Relative %: 64 %
Platelets: 62 10*3/uL — ABNORMAL LOW (ref 150–400)
RBC: 5.01 MIL/uL (ref 4.22–5.81)
RDW: 17.3 % — ABNORMAL HIGH (ref 11.5–15.5)
WBC: 3.3 10*3/uL — ABNORMAL LOW (ref 4.0–10.5)
nRBC: 0 % (ref 0.0–0.2)

## 2020-02-16 LAB — TRIGLYCERIDES: Triglycerides: 39 mg/dL (ref <150)

## 2020-02-16 LAB — COMPREHENSIVE METABOLIC PANEL
ALT: 16 U/L (ref 0–44)
AST: 26 U/L (ref 15–41)
Albumin: 3.1 g/dL — ABNORMAL LOW (ref 3.5–5.0)
Alkaline Phosphatase: 103 U/L (ref 38–126)
Anion gap: 12 (ref 5–15)
BUN: 16 mg/dL (ref 8–23)
CO2: 26 mmol/L (ref 22–32)
Calcium: 8.9 mg/dL (ref 8.9–10.3)
Chloride: 105 mmol/L (ref 98–111)
Creatinine, Ser: 0.91 mg/dL (ref 0.61–1.24)
GFR calc Af Amer: 59 mL/min — ABNORMAL LOW (ref >60)
GFR calc non Af Amer: 51 mL/min — ABNORMAL LOW (ref >60)
Glucose, Bld: 194 mg/dL — ABNORMAL HIGH (ref 70–99)
Potassium: 3.6 mmol/L (ref 3.5–5.1)
Sodium: 143 mmol/L (ref 135–145)
Total Bilirubin: 0.5 mg/dL (ref 0.3–1.2)
Total Protein: 7.1 g/dL (ref 6.5–8.1)

## 2020-02-16 LAB — I-STAT ARTERIAL BLOOD GAS, ED
Acid-Base Excess: 2 mmol/L (ref 0.0–2.0)
Bicarbonate: 29.3 mmol/L — ABNORMAL HIGH (ref 20.0–28.0)
Calcium, Ion: 1.21 mmol/L (ref 1.15–1.40)
HCT: 40 % (ref 39.0–52.0)
Hemoglobin: 13.6 g/dL (ref 13.0–17.0)
O2 Saturation: 100 %
Potassium: 2.9 mmol/L — ABNORMAL LOW (ref 3.5–5.1)
Sodium: 145 mmol/L (ref 135–145)
TCO2: 31 mmol/L (ref 22–32)
pCO2 arterial: 54 mmHg — ABNORMAL HIGH (ref 32.0–48.0)
pH, Arterial: 7.343 — ABNORMAL LOW (ref 7.350–7.450)
pO2, Arterial: 302 mmHg — ABNORMAL HIGH (ref 83.0–108.0)

## 2020-02-16 LAB — LACTIC ACID, PLASMA: Lactic Acid, Venous: 2.7 mmol/L (ref 0.5–1.9)

## 2020-02-16 LAB — URINALYSIS, ROUTINE W REFLEX MICROSCOPIC
Bilirubin Urine: NEGATIVE
Glucose, UA: NEGATIVE mg/dL
Ketones, ur: NEGATIVE mg/dL
Nitrite: NEGATIVE
Protein, ur: 100 mg/dL — AB
Specific Gravity, Urine: 1.019 (ref 1.005–1.030)
WBC, UA: >50 WBC/hpf — ABNORMAL HIGH (ref 0–5)
pH: 6 (ref 5.0–8.0)

## 2020-02-16 LAB — CBG MONITORING, ED: Glucose-Capillary: 160 mg/dL — ABNORMAL HIGH (ref 70–99)

## 2020-02-16 LAB — SARS CORONAVIRUS 2 BY RT PCR (HOSPITAL ORDER, PERFORMED IN ~~LOC~~ HOSPITAL LAB): SARS Coronavirus 2: NEGATIVE

## 2020-02-16 LAB — TROPONIN I (HIGH SENSITIVITY)
Troponin I (High Sensitivity): 15 ng/L (ref <18)
Troponin I (High Sensitivity): 41 ng/L — ABNORMAL HIGH (ref <18)

## 2020-02-16 LAB — GLUCOSE, CAPILLARY
Glucose-Capillary: 127 mg/dL — ABNORMAL HIGH (ref 70–99)
Glucose-Capillary: 109 mg/dL — ABNORMAL HIGH (ref 70–99)

## 2020-02-16 LAB — MRSA PCR SCREENING: MRSA by PCR: POSITIVE — AB

## 2020-02-16 MED ORDER — POLYETHYLENE GLYCOL 3350 17 G PO PACK
17.0000 g | PACK | Freq: Every day | ORAL | Status: DC
  Filled 2020-02-16: qty 1

## 2020-02-16 MED ORDER — PROPOFOL 1000 MG/100ML IV EMUL
0.0000 ug/kg/min | INTRAVENOUS | Status: DC
  Administered 2020-02-16: 5 ug/kg/min via INTRAVENOUS

## 2020-02-16 MED ORDER — DOCUSATE SODIUM 100 MG PO CAPS: 100.0000 mg | ORAL_CAPSULE | Freq: Two times a day (BID) | ORAL | Status: DC | PRN

## 2020-02-16 MED ORDER — SODIUM CHLORIDE 0.9 % IV SOLN: INTRAVENOUS | Status: DC

## 2020-02-16 MED ORDER — IPRATROPIUM-ALBUTEROL 0.5-2.5 (3) MG/3ML IN SOLN
3.0000 mL | Freq: Three times a day (TID) | RESPIRATORY_TRACT | Status: DC
  Administered 2020-02-16 – 2020-02-19 (×9): 3 mL via RESPIRATORY_TRACT
  Filled 2020-02-16 (×9): qty 3

## 2020-02-16 MED ORDER — DOCUSATE SODIUM 50 MG/5ML PO LIQD
100.0000 mg | Freq: Two times a day (BID) | ORAL | Status: DC | PRN
  Filled 2020-02-16: qty 10

## 2020-02-16 MED ORDER — DOCUSATE SODIUM 50 MG/5ML PO LIQD
100.0000 mg | Freq: Two times a day (BID) | ORAL | Status: DC
  Administered 2020-02-16: 100 mg
  Filled 2020-02-16 (×4): qty 10

## 2020-02-16 MED ORDER — SODIUM CHLORIDE 0.9 % IV SOLN
2.0000 g | INTRAVENOUS | Status: DC
  Administered 2020-02-16: 2 g via INTRAVENOUS
  Filled 2020-02-16: qty 20

## 2020-02-16 MED ORDER — PROPOFOL 1000 MG/100ML IV EMUL
INTRAVENOUS | Status: AC
  Filled 2020-02-16: qty 100

## 2020-02-16 MED ORDER — FENTANYL CITRATE (PF) 100 MCG/2ML IJ SOLN: 25.0000 ug | INTRAMUSCULAR | Status: DC | PRN

## 2020-02-16 MED ORDER — POLYETHYLENE GLYCOL 3350 17 G PO PACK: 17.0000 g | PACK | Freq: Every day | ORAL | Status: DC | PRN

## 2020-02-16 MED ORDER — CHLORHEXIDINE GLUCONATE 0.12% ORAL RINSE (MEDLINE KIT)
15.0000 mL | Freq: Two times a day (BID) | OROMUCOSAL | Status: DC
  Administered 2020-02-16 – 2020-02-19 (×5): 15 mL via OROMUCOSAL

## 2020-02-16 MED ORDER — ETOMIDATE 2 MG/ML IV SOLN
INTRAVENOUS | Status: AC | PRN
  Administered 2020-02-16: 25 mg via INTRAVENOUS

## 2020-02-16 MED ORDER — METHYLPREDNISOLONE SODIUM SUCC 125 MG IJ SOLR
125.0000 mg | Freq: Once | INTRAMUSCULAR | Status: AC
  Administered 2020-02-16: 125 mg via INTRAVENOUS
  Filled 2020-02-16: qty 2

## 2020-02-16 MED ORDER — ROCURONIUM BROMIDE 50 MG/5ML IV SOLN
INTRAVENOUS | Status: AC | PRN
  Administered 2020-02-16: 70 mg via INTRAVENOUS

## 2020-02-16 MED ORDER — DEXMEDETOMIDINE HCL IN NACL 400 MCG/100ML IV SOLN
0.0000 ug/kg/h | INTRAVENOUS | Status: DC
  Administered 2020-02-17: 0.4 ug/kg/h via INTRAVENOUS
  Filled 2020-02-16: qty 100

## 2020-02-16 MED ORDER — FENTANYL CITRATE (PF) 100 MCG/2ML IJ SOLN: 50.0000 ug | INTRAMUSCULAR | Status: DC | PRN

## 2020-02-16 MED ORDER — ORAL CARE MOUTH RINSE
15.0000 mL | OROMUCOSAL | Status: DC
  Administered 2020-02-16 – 2020-02-17 (×7): 15 mL via OROMUCOSAL

## 2020-02-16 MED ORDER — HEPARIN SODIUM (PORCINE) 5000 UNIT/ML IJ SOLN
5000.0000 [IU] | Freq: Three times a day (TID) | INTRAMUSCULAR | Status: DC
  Administered 2020-02-16 – 2020-02-24 (×22): 5000 [IU] via SUBCUTANEOUS
  Filled 2020-02-16 (×22): qty 1

## 2020-02-16 MED ORDER — POTASSIUM CHLORIDE 20 MEQ/15ML (10%) PO SOLN: 80.0000 meq | Freq: Once | ORAL | Status: DC

## 2020-02-16 MED ORDER — METHYLPREDNISOLONE SODIUM SUCC 40 MG IJ SOLR
40.0000 mg | Freq: Two times a day (BID) | INTRAMUSCULAR | Status: DC
  Administered 2020-02-16 – 2020-02-17 (×2): 40 mg via INTRAVENOUS
  Filled 2020-02-16 (×2): qty 1

## 2020-02-16 MED ORDER — FENTANYL CITRATE (PF) 100 MCG/2ML IJ SOLN
25.0000 ug | INTRAMUSCULAR | Status: DC | PRN
  Administered 2020-02-16 – 2020-02-17 (×4): 100 ug via INTRAVENOUS
  Filled 2020-02-16 (×4): qty 2

## 2020-02-16 MED ORDER — FENTANYL CITRATE (PF) 100 MCG/2ML IJ SOLN
50.0000 ug | INTRAMUSCULAR | Status: DC | PRN
  Administered 2020-02-16: 50 ug via INTRAVENOUS
  Filled 2020-02-16: qty 2

## 2020-02-16 MED ORDER — PANTOPRAZOLE SODIUM 40 MG IV SOLR
40.0000 mg | Freq: Every day | INTRAVENOUS | Status: DC
  Administered 2020-02-16 – 2020-02-17 (×2): 40 mg via INTRAVENOUS
  Filled 2020-02-16 (×2): qty 40

## 2020-02-16 MED ORDER — MIDAZOLAM HCL 2 MG/2ML IJ SOLN: 1.0000 mg | INTRAMUSCULAR | Status: DC | PRN

## 2020-02-16 MED ORDER — FENTANYL 2500MCG IN NS 250ML (10MCG/ML) PREMIX INFUSION
0.0000 ug/h | INTRAVENOUS | Status: DC
  Administered 2020-02-16: 100 ug/h via INTRAVENOUS
  Filled 2020-02-16: qty 250

## 2020-02-16 NOTE — ED Notes (Signed)
Checked foley insertion site. Insertion site appears fine.

## 2020-02-16 NOTE — Progress Notes (Signed)
Pt transported from ED to 23M called Ledell Noss RRT and notified her of new patient arrival. No events during transport.

## 2020-02-16 NOTE — ED Triage Notes (Signed)
Patient arrives via [facility] with complaints of respiratory distress. Per EMS pt was able to call 911 for SOB. When EMS arrived at pts motel room, pt was in respiratory distress with O2 sats in the 60's and RR rate >40. Pt was unable to speak to paramedics due to the SOB so currently unsure of pts name and hx. Pt trailed on BiPAP but do to decrease in consciousness, pt was intubated.

## 2020-02-16 NOTE — Progress Notes (Signed)
Transported to CT scan and back to ER room without complications.

## 2020-02-16 NOTE — Progress Notes (Signed)
Received pt in the ER at respiratory distress. Pt was on breathing treatment from EMS, breathing in the high 30s, and was very diminished. Attempted BIPAP to start with for only 5 minutes or less before ER MD decided to intubate. Pt is currently on ventilator, stable.

## 2020-02-16 NOTE — H&P (Signed)
NAME:  Victor Fitzgerald, MRN:  [id number], DOB:  06/05/1874, LOS: 0 ADMISSION DATE:  02/16/2020, CONSULTATION DATE:  02/16/20 REFERRING MD:  Freida Busman  CHIEF COMPLAINT:  AMS   Brief History   Victor Fitzgerald is a 74 y.o. male who was admitted 9/13 with dyspnea, AMS and UTI.  Required intubation in ED.  History of present illness   Pt is encephelopathic; therefore, this HPI is obtained from chart review. Victor Fitzgerald is a 74 y.o. male who has an unknown PMH.  He presented to [facility] ED 9/13 with dyspnea and AMS.  He was apparently at a motel when he called EMS stating that he was having trouble breathing.  In ED, he was initially placed on BiPAP but failed (had ongoing AMS) and required intubation. Per EDP, he had moderate bronchospasm prior to intubation.  He was treated with epi, mag, steroids.  Workup was essentially negative with exception of compensated hypercapnia (7.34 / 54 / 302) hypokalemia and UA suggestive of UTI.  UDS negative.   Past Medical History  Unknown.  Significant Hospital Events   9/13 > admit.  Consults:  None.  Procedures:  ETT 9/13 >   Significant Diagnostic Tests:  CT head 9/13 > neg. CXR 9/13 > neg.  Micro Data:  COVID 9/13 > neg. Blood 9/13 >  Urine 9/13 >  Sputum 9/13 >   Antimicrobials:  Ceftriaxone 9/13 >    Interim history/subjective:  Sedated but opens eyes to voice. BP labile after sedation.  Objective:  Blood pressure 101/64, pulse 73, temperature (!) 97.3 F (36.3 C), resp. rate 18, weight 70 kg, SpO2 98 %.    Vent Mode: PRVC FiO2 (%):  [40 %-60 %] 40 % Set Rate:  [18 bmp] 18 bmp Vt Set:  [620 mL] 620 mL PEEP:  [5 cmH20] 5 cmH20 Plateau Pressure:  [20 cmH20-25 cmH20] 25 cmH20  No intake or output data in the 24 hours ending 02/16/20 1415 Filed Weights   02/16/20 1000  Weight: 70 kg    Examination: General: Adult male, in NAD. Neuro: Sedated, opens eyes to voice. HEENT: [place]/AT. Sclerae anicteric.  ETT in place. Cardiovascular:  RRR, no M/R/G.  Lungs: Respirations even and unlabored.  CTA bilaterally, No W/R/R.  Abdomen: BS x 4, soft, NT/ND.  Musculoskeletal: No gross deformities, no edema.  Skin: Intact, warm, no rashes.  Assessment & Plan:   Acute on chronic hypercapnic respiratory failure - s/p intubation in ED. - Full vent support. - Sedation per PAD protocol. - SBT in AM. - Empiric steroids given bronchospasm prior to intubation. - Bronchial hygiene. - Follow CXR.  Acute encephalopathy - ? 2/2 above + UTI. - Supportive care.  Urosepsis - had labile BP's following intubation, some suspicion for sedation related. - Empiric ceftriaxone. - Follow cultures. - Fluid bolus. - If has ongoing hypotension, will initiate vasopressors.  Hypokalemia. - 80 mEq K per tube. - Follow BMP.   Best Practice:  Diet: NPO. Pain/Anxiety/Delirium protocol (if indicated): Fentanyl gtt / Midazolam PRN.  RASS goal -1. VAP protocol (if indicated): In place. DVT prophylaxis: SCD's / Heparin. GI prophylaxis: PPI. Glucose control: SSI if glucose consistently > 180. Mobility: Bedrest. Code Status: Full. Family Communication: Unknown identity and / or family.. Disposition: ICU.  Labs   CBC: Recent Labs  Lab 02/16/20 1057 02/16/20 1138  WBC 3.3*  --   NEUTROABS 2.1  --   HGB 12.5* 13.6  HCT 43.1 40.0  MCV 86.0  --  PLT 62*  --    Basic Metabolic Panel: Recent Labs  Lab 02/16/20 1057 02/16/20 1138  NA 143 145  K 3.6 2.9*  CL 105  --   CO2 26  --   GLUCOSE 194*  --   BUN 16  --   CREATININE 0.91  --   CALCIUM 8.9  --    GFR: CrCl cannot be calculated (Unknown ideal weight.). Recent Labs  Lab 02/16/20 1057  WBC 3.3*   Liver Function Tests: Recent Labs  Lab 02/16/20 1057  AST 26  ALT 16  ALKPHOS 103  BILITOT 0.5  PROT 7.1  ALBUMIN 3.1*   No results for input(s): LIPASE, AMYLASE in the last 168 hours. No results for input(s): AMMONIA in the last 168 hours. ABG    Component Value  Date/Time   PHART 7.343 (L) 02/16/2020 1138   PCO2ART 54.0 (H) 02/16/2020 1138   PO2ART 302 (H) 02/16/2020 1138   HCO3 29.3 (H) 02/16/2020 1138   TCO2 31 02/16/2020 1138   O2SAT 100.0 02/16/2020 1138    Coagulation Profile: No results for input(s): INR, PROTIME in the last 168 hours. Cardiac Enzymes: No results for input(s): CKTOTAL, CKMB, CKMBINDEX, TROPONINI in the last 168 hours. HbA1C: No results found for: HGBA1C CBG: Recent Labs  Lab 02/16/20 1047  GLUCAP 160*    Review of Systems:   Unable to obtain as pt is encephalopathic.  Past medical history  He,  has no past medical history on file.   Surgical History   Unknown.  Social History      Family history   His family history is not on file.   Allergies No Known Allergies   Home meds  Prior to Admission medications   Not on File    Critical care time: 35 min.    Rutherford Guys, [place] [facility] 02/16/2020, 2:15 PM

## 2020-02-16 NOTE — Progress Notes (Signed)
Checked on patient  Still sleepy on and off, not able to stay awake and interact  Reinitiate fentanyl pushes Precedex  Continue other lines of care

## 2020-02-16 NOTE — ED Provider Notes (Signed)
[facility] EMERGENCY DEPARTMENT Provider Note   CSN: [id number] Arrival date & time: 02/16/20  1024     History No chief complaint on file.   Victor Fitzgerald is a 74 y.o. male.  Approximately 74 year old male who presents in respiratory distress via EMS.  Patient was living hotel room and called EMS and had trouble breathing.  Patient cannot verbalize due to his state.  On their arrival, patient found to be in extremis with wheezing throughout.  According to EMS, patient given epinephrine along with magnesium and albuterol treatments.  Patient had minimal response to these therapies and was transported here        No past medical history on file.  There are no problems to display for this patient.      No family history on file.  Social History   Tobacco Use  . Smoking status: Not on file  Substance Use Topics  . Alcohol use: Not on file  . Drug use: Not on file    Home Medications Prior to Admission medications   Not on File    Allergies    Patient has no allergy information on record.  Review of Systems   Review of Systems  Unable to perform ROS: Severe respiratory distress    Physical Exam Updated Vital Signs Wt 70 kg Comment: estimated  Physical Exam Vitals and nursing note reviewed.  Constitutional:      General: He is not in acute distress.    Appearance: Normal appearance. He is well-developed. He is not toxic-appearing.  HENT:     Head: Normocephalic and atraumatic.  Eyes:     General: Lids are normal.     Conjunctiva/sclera: Conjunctivae normal.     Pupils: Pupils are equal, round, and reactive to light.  Neck:     Thyroid: No thyroid mass.     Trachea: No tracheal deviation.  Cardiovascular:     Rate and Rhythm: Regular rhythm. Tachycardia present.     Heart sounds: Normal heart sounds. No murmur heard.  No gallop.   Pulmonary:     Effort: Pulmonary effort is normal. No respiratory distress.     Breath sounds:  Normal breath sounds. No stridor. No decreased breath sounds, wheezing, rhonchi or rales.  Abdominal:     General: Bowel sounds are normal. There is no distension.     Palpations: Abdomen is soft.     Tenderness: There is no abdominal tenderness. There is no rebound.  Musculoskeletal:        General: No tenderness. Normal range of motion.     Cervical back: Normal range of motion and neck supple.  Skin:    General: Skin is warm and dry.     Findings: No abrasion or rash.  Neurological:     Mental Status: He is lethargic and disoriented.     GCS: GCS eye subscore is 4. GCS verbal subscore is 1. GCS motor subscore is 3.  Psychiatric:        Attention and Perception: He is inattentive.     ED Results / Procedures / Treatments   Labs (all labs ordered are listed, but only abnormal results are displayed) Labs Reviewed  TRIGLYCERIDES    EKG EKG Interpretation  Date/Time:  Monday February 16 2020 10:39:50 EDT Ventricular Rate:  138 PR Interval:    QRS Duration: 76 QT Interval:  350 QTC Calculation: 531 R Axis:   79 Text Interpretation: Sinus tachycardia Ventricular premature complex Repolarization abnormality, prob rate  related Prolonged QT interval No old tracing to compare Confirmed by Lorre Nick ([id number]) on 02/16/2020 12:51:14 PM   Radiology No results found.  Procedures Procedure Name: Intubation Date/Time: 02/16/2020 10:42 AM Performed by: Lorre Nick, MD Pre-anesthesia Checklist: Patient identified, Patient being monitored, Emergency Drugs available, Timeout performed and Suction available Oxygen Delivery Method: Non-rebreather mask Preoxygenation: Pre-oxygenation with 100% oxygen Induction Type: Rapid sequence Ventilation: Mask ventilation without difficulty Laryngoscope Size: 2 Grade View: Grade I Tube size: 7.5 mm Number of attempts: 1 Airway Equipment and Method: Video-laryngoscopy Placement Confirmation: ETT inserted through vocal cords under direct  vision,  CO2 detector and Breath sounds checked- equal and bilateral Secured at: 25 cm Tube secured with: ETT holder Difficulty Due To: Difficulty was unanticipated      (including critical care time)  Medications Ordered in ED Medications  etomidate (AMIDATE) injection (25 mg Intravenous Given 02/16/20 1034)  rocuronium (ZEMURON) injection (70 mg Intravenous Given 02/16/20 1034)  propofol (DIPRIVAN) 1000 MG/100ML infusion (has no administration in time range)  propofol (DIPRIVAN) 1000 MG/100ML infusion (has no administration in time range)  fentaNYL (SUBLIMAZE) injection 50 mcg (has no administration in time range)  fentaNYL (SUBLIMAZE) injection 50 mcg (has no administration in time range)  0.9 %  sodium chloride infusion (has no administration in time range)    ED Course  I have reviewed the triage vital signs and the nursing notes.  Pertinent labs & imaging results that were available during my care of the patient were reviewed by me and considered in my medical decision making (see chart for details).    MDM Rules/Calculators/A&P                          Attempted to place patient on BiPAP without success Patient intubated as above.  Given post intubation sedation with fentanyl and propofol.  Chest x-ra results noted for placement of endotracheal tube.  Head CT without acute findings.  Blood gas results noted.  Labs reviewed and shows increased white blood cells in the urine.  Patient started on 2 g of Rocephin.  Discussed with critical care and patient admitted to the ICU  CRITICAL CARE Performed by: Toy Baker Total critical care time: 75 minutes Critical care time was exclusive of separately billable procedures and treating other patients. Critical care was necessary to treat or prevent imminent or life-threatening deterioration. Critical care was time spent personally by me on the following activities: development of treatment plan with patient and/or surrogate as  well as nursing, discussions with consultants, evaluation of patient's response to treatment, examination of patient, obtaining history from patient or surrogate, ordering and performing treatments and interventions, ordering and review of laboratory studies, ordering and review of radiographic studies, pulse oximetry and re-evaluation of patient's condition.  Final Clinical Impression(s) / ED Diagnoses Final diagnoses:  None    Rx / DC Orders ED Discharge Orders    None       Lorre Nick, MD 02/16/20 1252

## 2020-02-17 ENCOUNTER — Inpatient Hospital Stay (HOSPITAL_COMMUNITY): Payer: Medicare Other

## 2020-02-17 LAB — BASIC METABOLIC PANEL
Anion gap: 9 (ref 5–15)
BUN: 19 mg/dL (ref 8–23)
CO2: 26 mmol/L (ref 22–32)
Calcium: 8.8 mg/dL — ABNORMAL LOW (ref 8.9–10.3)
Chloride: 108 mmol/L (ref 98–111)
Creatinine, Ser: 0.75 mg/dL (ref 0.61–1.24)
GFR calc Af Amer: >60 mL/min (ref >60)
GFR calc non Af Amer: >60 mL/min (ref >60)
Glucose, Bld: 127 mg/dL — ABNORMAL HIGH (ref 70–99)
Potassium: 3.9 mmol/L (ref 3.5–5.1)
Sodium: 143 mmol/L (ref 135–145)

## 2020-02-17 LAB — CBC
HCT: 36.3 % — ABNORMAL LOW (ref 39.0–52.0)
Hemoglobin: 11.1 g/dL — ABNORMAL LOW (ref 13.0–17.0)
MCH: 25.1 pg — ABNORMAL LOW (ref 26.0–34.0)
MCHC: 30.6 g/dL (ref 30.0–36.0)
MCV: 82.1 fL (ref 80.0–100.0)
Platelets: 54 10*3/uL — ABNORMAL LOW (ref 150–400)
RBC: 4.42 MIL/uL (ref 4.22–5.81)
RDW: 17.2 % — ABNORMAL HIGH (ref 11.5–15.5)
WBC: 3.9 10*3/uL — ABNORMAL LOW (ref 4.0–10.5)
nRBC: 0 % (ref 0.0–0.2)

## 2020-02-17 LAB — POCT I-STAT 7, (LYTES, BLD GAS, ICA,H+H)
Acid-Base Excess: 4 mmol/L — ABNORMAL HIGH (ref 0.0–2.0)
Bicarbonate: 29.4 mmol/L — ABNORMAL HIGH (ref 20.0–28.0)
Calcium, Ion: 1.24 mmol/L (ref 1.15–1.40)
HCT: 34 % — ABNORMAL LOW (ref 39.0–52.0)
Hemoglobin: 11.6 g/dL — ABNORMAL LOW (ref 13.0–17.0)
O2 Saturation: 100 %
Patient temperature: 98.6
Potassium: 3.7 mmol/L (ref 3.5–5.1)
Sodium: 145 mmol/L (ref 135–145)
TCO2: 31 mmol/L (ref 22–32)
pCO2 arterial: 46.7 mmHg (ref 32.0–48.0)
pH, Arterial: 7.407 (ref 7.350–7.450)
pO2, Arterial: 188 mmHg — ABNORMAL HIGH (ref 83.0–108.0)

## 2020-02-17 LAB — GLUCOSE, CAPILLARY
Glucose-Capillary: 101 mg/dL — ABNORMAL HIGH (ref 70–99)
Glucose-Capillary: 86 mg/dL (ref 70–99)
Glucose-Capillary: 88 mg/dL (ref 70–99)

## 2020-02-17 LAB — MAGNESIUM: Magnesium: 1.8 mg/dL (ref 1.7–2.4)

## 2020-02-17 LAB — TRIGLYCERIDES: Triglycerides: 37 mg/dL (ref <150)

## 2020-02-17 LAB — PHOSPHORUS: Phosphorus: 3.3 mg/dL (ref 2.5–4.6)

## 2020-02-17 MED ORDER — AZITHROMYCIN 250 MG PO TABS: 250.0000 mg | ORAL_TABLET | Freq: Every day | ORAL | Status: DC

## 2020-02-17 MED ORDER — PREDNISONE 5 MG PO TABS
30.0000 mg | ORAL_TABLET | Freq: Every day | ORAL | Status: AC
  Administered 2020-02-17 – 2020-02-21 (×5): 30 mg via ORAL
  Filled 2020-02-17: qty 2
  Filled 2020-02-17: qty 3
  Filled 2020-02-17 (×3): qty 2

## 2020-02-17 MED ORDER — CHLORHEXIDINE GLUCONATE CLOTH 2 % EX PADS
6.0000 | MEDICATED_PAD | Freq: Every day | CUTANEOUS | Status: DC
  Administered 2020-02-17 – 2020-02-24 (×4): 6 via TOPICAL

## 2020-02-17 MED ORDER — DOCUSATE SODIUM 100 MG PO CAPS: 100.0000 mg | ORAL_CAPSULE | Freq: Two times a day (BID) | ORAL | Status: DC | PRN

## 2020-02-17 MED ORDER — MAGNESIUM SULFATE 2 GM/50ML IV SOLN
2.0000 g | Freq: Once | INTRAVENOUS | Status: AC
  Administered 2020-02-17: 2 g via INTRAVENOUS
  Filled 2020-02-17: qty 50

## 2020-02-17 MED ORDER — POLYETHYLENE GLYCOL 3350 17 G PO PACK
17.0000 g | PACK | Freq: Every day | ORAL | Status: DC
  Administered 2020-02-18 – 2020-02-24 (×5): 17 g via ORAL
  Filled 2020-02-17 (×7): qty 1

## 2020-02-17 MED ORDER — IPRATROPIUM-ALBUTEROL 0.5-2.5 (3) MG/3ML IN SOLN
3.0000 mL | RESPIRATORY_TRACT | Status: DC | PRN
  Administered 2020-02-17 – 2020-02-19 (×2): 3 mL via RESPIRATORY_TRACT
  Filled 2020-02-17: qty 3

## 2020-02-17 MED ORDER — ORAL CARE MOUTH RINSE
15.0000 mL | Freq: Two times a day (BID) | OROMUCOSAL | Status: DC
  Administered 2020-02-17 – 2020-02-24 (×10): 15 mL via OROMUCOSAL

## 2020-02-17 MED ORDER — DOXYCYCLINE HYCLATE 100 MG PO TABS
100.0000 mg | ORAL_TABLET | Freq: Two times a day (BID) | ORAL | Status: AC
  Administered 2020-02-17 – 2020-02-22 (×12): 100 mg via ORAL
  Filled 2020-02-17 (×12): qty 1

## 2020-02-17 MED ORDER — DOCUSATE SODIUM 100 MG PO CAPS
100.0000 mg | ORAL_CAPSULE | Freq: Two times a day (BID) | ORAL | Status: DC
  Administered 2020-02-17 – 2020-02-24 (×14): 100 mg via ORAL
  Filled 2020-02-17 (×14): qty 1

## 2020-02-17 MED ORDER — MUPIROCIN 2 % EX OINT
1.0000 "application " | TOPICAL_OINTMENT | Freq: Two times a day (BID) | CUTANEOUS | Status: AC
  Administered 2020-02-17 – 2020-02-21 (×10): 1 via NASAL
  Filled 2020-02-17 (×2): qty 22

## 2020-02-17 MED ORDER — CEFDINIR 300 MG PO CAPS
300.0000 mg | ORAL_CAPSULE | Freq: Two times a day (BID) | ORAL | Status: AC
  Administered 2020-02-17 – 2020-02-21 (×10): 300 mg via ORAL
  Filled 2020-02-17 (×11): qty 1

## 2020-02-17 NOTE — Progress Notes (Signed)
PULMONARY / CRITICAL CARE MEDICINE   NAME:  Victor Fitzgerald, MRN:  [id number], DOB:  1945-12-12, LOS: 1 ADMISSION DATE:  02/16/2020, CONSULTATION DATE:  02/16/20 REFERRING MD:  Freida Busman, CHIEF COMPLAINT:  Dyspnea/AMS  ASSESSMENT AND PLAN   Acute on chronic hypercapnic respiratory failure - s/p intubation in ED, improving. RSBI 30, controlling secretions, neuro status improved, ready for extubation. CXR consistent with COPD/emphysema, will treat as exacerbation and get further history once patient is extubated. - Extubate - Continue steroids (5 day course) and Duonebs - Transition to azithromycin once extubated for 5 day course - Follow CXR  UTI - Urine culture pending - Discontinue ceftriaxone - Transition to cefdinir  - Follow cultures, change abx accordingly .  Hypokalemia, improved. - 80 mEq K per tube. - Follow BMP.  Encephalopathy, improved No clear underlying cause, mildly elevated CO2 on admission, potential urosepsis as a factor, neuro status normalized on exam today. Continue to monitor.    SUMMARY OF TODAY'S PLAN:  Extubation and transition to PO abx  Brief History   Victor Fitzgerald is a 74 y.o. male who was admitted 9/13 with dyspnea, AMS and UTI.  Required intubation in ED.  History of present illness   Pt is encephelopathic; therefore, this HPI is obtained from chart review. Victor Fitzgerald is a 74 y.o. male who has an unknown PMH.  He presented to [facility] ED 9/13 with dyspnea and AMS.  He was apparently at a motel when he called EMS stating that he was having trouble breathing.  In ED, he was initially placed on BiPAP but failed (had ongoing AMS) and required intubation. Per EDP, he had moderate bronchospasm prior to intubation.  He was treated with epi, mag, steroids.  Workup was essentially negative with exception of compensated hypercapnia (7.34 / 54 / 302) hypokalemia and UA suggestive of UTI.  UDS negative.   Past Medical History  Unknown.  Significant Hospital  Events   9/13 > admit.  Consults:  None.  Procedures:  ETT 9/13 >   Significant Diagnostic Tests:  CT head 9/13 > neg. CXR 9/13 > neg.  Micro Data:  COVID 9/13 > neg. Blood 9/13 >  Urine 9/13 >  Sputum 9/13 >   Antimicrobials:  Ceftriaxone 9/13 >    Interim history/subjective:   BP weaned off sedation, now fully awake No acute events overnight  CONSTITUTIONAL: BP (!) 141/73   Pulse (!) 56   Temp 98.6 F (37 C) (Oral)   Resp 12   Wt 75.5 kg   SpO2 99%   I/O last 3 completed shifts: In: 2600.1 [I.V.:2400.1; NG/GT:100; IV Piggyback:100] Out: 450 [Urine:450]   Vent Mode: PSV;CPAP FiO2 (%):  [40 %-60 %] 40 % Set Rate:  [15 bmp-18 bmp] 15 bmp Vt Set:  [620 mL] 620 mL PEEP:  [5 cmH20] 5 cmH20 Pressure Support:  [5 cmH20] 5 cmH20 Plateau Pressure:  [6 cmH20-25 cmH20] 6 cmH20  PHYSICAL EXAM: General: Adult male, in NAD. Neuro: Awake, following simple commands, controlling secretions, HEENT: [place]/AT. Sclerae anicteric.  ETT in place. Cardiovascular: RRR, no M/R/G.  Lungs: Respirations even and unlabored.  CTA bilaterally, No W/R/R. Strong cough Abdomen: BS x 4, soft, NT/ND.  Musculoskeletal: No gross deformities, no edema.  Skin: Intact, warm, no rashes.  Best Practice / Goals of Care / Disposition.   Diet: NPO. Pain/Anxiety/Delirium protocol (if indicated): Fentanyl discontinued VAP protocol (if indicated): Discontinued with extubation DVT prophylaxis: SCD's / Heparin. GI prophylaxis: PPI. Glucose control: SSI if glucose consistently >  180. Mobility: Bedrest.  Code Status: Full. Family Communication: Unknown- will obtain info following extubation Disposition: ICU.   LABS  Glucose Recent Labs  Lab 02/16/20 1047 02/16/20 1935 02/16/20 2328 02/17/20 0328 02/17/20 0759  GLUCAP 160* 127* 109* 101* 86    BMET Recent Labs  Lab 02/16/20 1057 02/16/20 1138 02/16/20 1835 02/17/20 0143 02/17/20 0201  NA 143   < > 142 143 145  K 3.6   < > 3.7 3.9  3.7  CL 105  --  105 108  --   CO2 26  --  27 26  --   BUN 16  --  18 19  --   CREATININE 0.91  --  0.87 0.75  --   GLUCOSE 194*  --  130* 127*  --    < > = values in this interval not displayed.    Liver Enzymes Recent Labs  Lab 02/16/20 1057  AST 26  ALT 16  ALKPHOS 103  BILITOT 0.5  ALBUMIN 3.1*    Electrolytes Recent Labs  Lab 02/16/20 1057 02/16/20 1835 02/17/20 0143  CALCIUM 8.9 8.7* 8.8*  MG  --   --  1.8  PHOS  --   --  3.3    CBC Recent Labs  Lab 02/16/20 1057 02/16/20 1057 02/16/20 1138 02/17/20 0143 02/17/20 0201  WBC 3.3*  --   --  3.9*  --   HGB 12.5*   < > 13.6 11.1* 11.6*  HCT 43.1   < > 40.0 36.3* 34.0*  PLT 62*  --   --  54*  --    < > = values in this interval not displayed.    ABG Recent Labs  Lab 02/16/20 1138 02/17/20 0201  PHART 7.343* 7.407  PCO2ART 54.0* 46.7  PO2ART 302* 188*    Coag's No results for input(s): APTT, INR in the last 168 hours.  Sepsis Markers Recent Labs  Lab 02/16/20 1340  LATICACIDVEN 2.7*    Cardiac Enzymes No results for input(s): TROPONINI, PROBNP in the last 168 hours.  PAST MEDICAL HISTORY :   He  has no past medical history on file.  PAST SURGICAL HISTORY:  He  has no past surgical history on file.  No Known Allergies  No current facility-administered medications on file prior to encounter.   No current outpatient medications on file prior to encounter.    FAMILY HISTORY:   His family history is not on file.  SOCIAL HISTORY:  He    REVIEW OF SYSTEMS:    Not obtained 2/2 pt intubated   Anette Riedel, MS4

## 2020-02-17 NOTE — Procedures (Signed)
Extubation Procedure Note  Patient Details:   Name: Linwood Gullikson DOB: 02-26-1946 MRN: [id number]   Airway Documentation:    Vent end date: 02/17/20 Vent end time: 1030   Evaluation  O2 sats: stable throughout Complications: No apparent complications Patient did tolerate procedure well. Bilateral Breath Sounds: Diminished   Yes   Patient extubated to 2L nasal cannula per MD order.  Positive cuff leak noted.  No evidence of stridor.  Patient able to speak post extubation.  Sats currently 100%.  Vitals are stable.  No complications at this time.   Elyn Peers 02/17/2020, 10:34 AM

## 2020-02-17 NOTE — Progress Notes (Signed)
Pt says he had a cell phone. He last had it in the ED before transferring to . Apparently, RN who admitted pt yesterday filled out documentation about missing phone. This RN contacted security who said no cell phone has been turned in. Social work consulted and will help if needed.

## 2020-02-17 NOTE — TOC Initial Note (Signed)
Transition of Care [facility]) - Initial/Assessment Note    Patient Details  Name: Victor Fitzgerald MRN: [id number] Date of Birth: March 12, 1946  [facility] CM/SW Contact:    Lockie Pares, RN Phone Number: 02/17/2020, 9:45 AM  Clinical Narrative:                  Patient admitted for bronchospasm respiratory failure. No family contacts listed.  No PCP listed. Plainest is currently in ICU intubated. CM will follow for needs. Refer to PCP internal medicine.    Expected Discharge Plan: Home w Home Health Services Barriers to Discharge: ED Unable to Make Contact with Facility/Family, Continued Medical Work up   Patient Goals and CMS Choice        Expected Discharge Plan and Services Expected Discharge Plan: Home w Home Health Services   Discharge Planning Services: CM Consult   Living arrangements for the past 2 months: Apartment                                      Prior Living Arrangements/Services Living arrangements for the past 2 months: Apartment Lives with:: Self Patient language and need for interpreter reviewed:: Yes        Need for Family Participation in Patient Care: Yes (Comment) Care giver support system in place?: No (comment)   Criminal Activity/Legal Involvement Pertinent to Current Situation/Hospitalization: No - Comment as needed  Activities of Daily Living      Permission Sought/Granted                  Emotional Assessment   Attitude/Demeanor/Rapport: Intubated (Following Commands or Not Following Commands) Affect (typically observed): Unable to Assess Orientation: : Fluctuating Orientation (Suspected and/or reported Sundowners) (sedated on ventilator) Alcohol / Substance Use: Not Applicable Psych Involvement: No (comment)  Admission diagnosis:  Acute encephalopathy [G93.40] Patient Active Problem List   Diagnosis Date Noted  . Acute encephalopathy 02/16/2020   PCP:  Patient, No Pcp Per Pharmacy:  No Pharmacies  Listed    Social Determinants of Health (SDOH) Interventions    Readmission Risk Interventions No flowsheet data found.

## 2020-02-17 NOTE — Progress Notes (Signed)
Patient transported to 3W-32 without complication.  Barbaraann Cao, RN  02/17/20 2115

## 2020-02-18 DIAGNOSIS — E872 Acidosis, unspecified: Secondary | ICD-10-CM | POA: Diagnosis present

## 2020-02-18 DIAGNOSIS — N390 Urinary tract infection, site not specified: Secondary | ICD-10-CM

## 2020-02-18 DIAGNOSIS — J9602 Acute respiratory failure with hypercapnia: Secondary | ICD-10-CM | POA: Diagnosis present

## 2020-02-18 DIAGNOSIS — J439 Emphysema, unspecified: Secondary | ICD-10-CM | POA: Diagnosis present

## 2020-02-18 DIAGNOSIS — R778 Other specified abnormalities of plasma proteins: Secondary | ICD-10-CM

## 2020-02-18 DIAGNOSIS — B9620 Unspecified Escherichia coli [E. coli] as the cause of diseases classified elsewhere: Secondary | ICD-10-CM | POA: Diagnosis present

## 2020-02-18 LAB — URINE CULTURE: Culture: >=100000 — AB

## 2020-02-18 LAB — TRIGLYCERIDES: Triglycerides: 62 mg/dL (ref <150)

## 2020-02-18 MED ORDER — GUAIFENESIN ER 600 MG PO TB12
1200.0000 mg | ORAL_TABLET | Freq: Two times a day (BID) | ORAL | Status: DC
  Administered 2020-02-18 – 2020-02-24 (×13): 1200 mg via ORAL
  Filled 2020-02-18 (×13): qty 2

## 2020-02-18 NOTE — TOC Progression Note (Addendum)
Transition of Care [facility]) - Progression Note    Patient Details  Name: Victor Fitzgerald MRN: [id number] Date of Birth: 1945/08/26  [facility]) CM/SW Contact  Pollie Friar, RN Phone Number: 02/18/2020, 11:38 AM  Clinical Narrative:    Pt states he was staying in a motel but has checked out and is currently homeless. Pt states he has no family or friends he can stay with.  CM has asked MD for PT/OT evals and also awaiting ambulatory oxygen saturation.  Pt wasn't taking any medications prior to admission and has no PCP.  TOC following for therapy recs and will assist with PCP as needed.   1425: Recommendations are for SNF. CM met with the patient and he is in agreement with being faxed out in the [facility] area.   Expected Discharge Plan: Homeless Shelter Barriers to Discharge: Continued Medical Work up  Expected Discharge Plan and Services Expected Discharge Plan: Homeless Shelter   Discharge Planning Services: CM Consult   Living arrangements for the past 2 months: Hotel/Motel (But has check out and states has no where to go)                                       Social Determinants of Health (SDOH) Interventions    Readmission Risk Interventions No flowsheet data found.

## 2020-02-18 NOTE — Progress Notes (Signed)
Occupational Therapy Evaluation  Patient with functional deficits listed below impacting patient safety and independence with self care. Patient not requiring significant physical assistance for self care however reports dyspnea on exertion and audible wheezing with ambulation from bathroom. Patient 100% on RA supine, 97% donning socks at EOB and 94% post ambulation from bathroom, RN made aware. Patient initially min G assist with ambulation to bathroom holding onto IV pole with narrow base of support and pt report stiff B ankles and knee due to arthritis. Instruct patient to use walker with ambulation back to EOB at supervision level.   Anticipate with further management of respiratory status patient will progress back to mod I level with self care tasks and no further OT needs, will continue to follow acutely to maximize patient activity tolerance and further education in energy conservation strategies to maximize patient independence with self care.     02/18/20 1211  OT Visit Information  Last OT Received On 02/18/20  Assistance Needed +1  History of Present Illness Patient is a 74 year old male with unknown PMH admitted with acute on chronic hypercapnic respiratory failure requiring intubation in ED, extubated 9/14.   Precautions  Precautions Fall  Precaution Comments monitor saturation  Restrictions  Weight Bearing Restrictions No  Home Living  Family/patient expects to be discharged to: Unsure (was staying in motel until hospitalization )  Living Arrangements Alone  Available Help at Discharge Friend(s);Available PRN/intermittently  Type of Home Homeless  Additional Comments was staying in a motel until hospitalization, hopeful friend can help him find a place to move into  Prior Function  Level of Independence Independent  Communication  Communication No difficulties  Pain Assessment  Pain Assessment Faces  Faces Pain Scale 4  Pain Location ankles, R knee (arthritis pain)   Pain Descriptors / Indicators  (stiff, swollen)  Pain Intervention(s) Monitored during session  Cognition  Arousal/Alertness Awake/alert  Behavior During Therapy WFL for tasks assessed/performed  Overall Cognitive Status Within Functional Limits for tasks assessed  Upper Extremity Assessment  Upper Extremity Assessment Overall WFL for tasks assessed  Lower Extremity Assessment  Lower Extremity Assessment Defer to PT evaluation  Cervical / Trunk Assessment  Cervical / Trunk Assessment Normal  ADL  Overall ADL's  Needs assistance/impaired  Grooming Set up;Sitting  Upper Body Bathing Set up;Sitting  Lower Body Bathing Min guard;Sit to/from stand  Upper Body Dressing  Set up;Sitting  Lower Body Dressing Set up;Min guard;Sitting/lateral leans;Sit to/from stand  Lower Body Dressing Details (indicate cue type and reason) set up assist to don socks seated EOB, min G in standing for stability due to dyspnea with exertion  Toilet Transfer Min guard;Ambulation;Regular Toilet;RW  Toilet Transfer Details (indicate cue type and reason) ambulate to bathroom holding onto IV pole with min G assist for safety due to narrow base of support and joint stiffness, had patient use walker to return to edge of bed at supervision level   Toileting- Architect and Hygiene Supervision/safety;Sit to/from stand  Functional mobility during ADLs Supervision/safety;Min guard;Rolling walker  General ADL Comments patient requiring increased assistance for self care tasks due to decreased activity tolerance and dyspnea on exertion, cues for pursed lip breathing techniques   Bed Mobility  Overal bed mobility Modified Independent  Transfers  Overall transfer level Needs assistance  Equipment used None  Transfers Sit to/from Stand  Sit to Stand Supervision  General transfer comment no physical assistance to stand from edge of bed, min G initially without AD to ambulate to  bathroom and utilize walker when  returning to EOB  Balance  Overall balance assessment Needs assistance  Sitting-balance support Feet supported  Sitting balance-Leahy Scale Good  Standing balance support No upper extremity supported  Standing balance-Leahy Scale Fair  Standing balance comment static stand without UE support  General Comments  General comments (skin integrity, edema, etc.) patient 100% on room air supine, 97% EOB with donning socks and 94% after ambulation from bathroom on room air  OT - End of Session  Equipment Utilized During Treatment Rolling walker  Activity Tolerance Other (comment) (patient limited by cardiopumonary status/wheezing)  Patient left in bed;with call bell/phone within reach;with nursing/sitter in room  Nurse Communication Mobility status;Other (comment) (O2 on room air)  OT Assessment  OT Recommendation/Assessment Patient needs continued OT Services  OT Visit Diagnosis Other abnormalities of gait and mobility (R26.89);Pain  Pain - Right/Left Right  Pain - part of body Knee (bilateral ankles)  OT Problem List Decreased activity tolerance;Impaired balance (sitting and/or standing);Cardiopulmonary status limiting activity;Pain;Decreased safety awareness  Barriers to Discharge Other (comment)  Barriers to Discharge Comments homeless  OT Plan  OT Frequency (ACUTE ONLY) Min 2X/week  OT Treatment/Interventions (ACUTE ONLY) Self-care/ADL training;Energy conservation;Therapeutic activities;Patient/family education;Balance training  AM-PAC OT "6 Clicks" Daily Activity Outcome Measure (Version 2)  Help from another person eating meals? 4  Help from another person taking care of personal grooming? 3  Help from another person toileting, which includes using toliet, bedpan, or urinal? 3  Help from another person bathing (including washing, rinsing, drying)? 3  Help from another person to put on and taking off regular upper body clothing? 3  Help from another person to put on and taking off  regular lower body clothing? 3  6 Click Score 19  OT Recommendation  Follow Up Recommendations No OT follow up;Supervision - Intermittent  OT Equipment Other (comment) (rolling walker )  Individuals Consulted  Consulted and Agree with Results and Recommendations Patient  Acute Rehab OT Goals  Patient Stated Goal breathe better  OT Goal Formulation With patient  Time For Goal Achievement 03/03/20  Potential to Achieve Goals Good  OT Time Calculation  OT Start Time (ACUTE ONLY) 1140  OT Stop Time (ACUTE ONLY) 1203  OT Time Calculation (min) 23 min  OT General Charges  $OT Visit 1 Visit  OT Evaluation  $OT Eval Low Complexity 1 Low  OT Treatments  $Self Care/Home Management  8-22 mins  Written Expression  Dominant Hand Right   Marlyce Huge OT OT pager: [phone]

## 2020-02-18 NOTE — Progress Notes (Signed)
02/18/20 1359  PT Visit Information  Last PT Received On 02/18/20  Assistance Needed +1  History of Present Illness Patient is a 74 year old male with unknown PMH admitted with acute on chronic hypercapnic respiratory failure requiring intubation in ED, extubated 9/14.   Precautions  Precautions Fall  Precaution Comments monitor saturation  Restrictions  Weight Bearing Restrictions No  Home Living  Family/patient expects to be discharged to: Unsure  Additional Comments Pt reports he was staying in a motel, but is unsure where he will go after  Prior Function  Level of Independence Independent  Communication  Communication No difficulties  Pain Assessment  Pain Assessment Faces  Faces Pain Scale 4  Pain Location ankles, R knee (arthritis pain)  Pain Descriptors / Indicators Grimacing;Guarding  Pain Intervention(s) Limited activity within patient's tolerance;Monitored during session;Repositioned  Cognition  Arousal/Alertness Awake/alert  Behavior During Therapy WFL for tasks assessed/performed  Overall Cognitive Status Within Functional Limits for tasks assessed  Upper Extremity Assessment  Upper Extremity Assessment Defer to OT evaluation  Lower Extremity Assessment  Lower Extremity Assessment Generalized weakness;RLE deficits/detail  RLE Deficits / Details R toe out during gait. Reports increased pain in R ankle and knee.   Cervical / Trunk Assessment  Cervical / Trunk Assessment Normal  Bed Mobility  Overal bed mobility Modified Independent  Transfers  Overall transfer level Needs assistance  Equipment used None  Transfers Sit to/from Stand  Sit to Stand Min guard  General transfer comment Min guard A for safety. Increased time required to come to standing.   Ambulation/Gait  Ambulation/Gait assistance Min guard  Gait Distance (Feet) 15 Feet  Assistive device IV Pole  Gait Pattern/deviations Step-to pattern;Decreased step length - left;Decreased step length -  right;Decreased weight shift to right;Antalgic  General Gait Details Slow, antalgic gait. Very poor activity tolerance and only able to tolerate gait to bathroom and back to bed. Pt with increased SOB and wheezing; oxygen sats at 92% on RA. Further mobility deferred.   Gait velocity Decreased  Balance  Overall balance assessment Needs assistance  Sitting-balance support No upper extremity supported;Feet supported  Sitting balance-Leahy Scale Good  Standing balance support No upper extremity supported;During functional activity  Standing balance-Leahy Scale Fair  Standing balance comment Can statically standing without UE support   PT - End of Session  Equipment Utilized During Treatment Gait belt  Activity Tolerance Patient limited by fatigue;Treatment limited secondary to medical complications (Comment) (increased SOB )  Patient left in bed;with call bell/phone within reach (sitting EOB )  Nurse Communication Mobility status  PT Assessment  PT Recommendation/Assessment Patient needs continued PT services  PT Visit Diagnosis Difficulty in walking, not elsewhere classified (R26.2);Other abnormalities of gait and mobility (R26.89)  PT Problem List Decreased activity tolerance;Decreased strength;Decreased mobility;Decreased safety awareness;Cardiopulmonary status limiting activity  Barriers to Discharge Decreased caregiver support;Inaccessible home environment  PT Plan  PT Frequency (ACUTE ONLY) Min 2X/week  PT Treatment/Interventions (ACUTE ONLY) Gait training;DME instruction;Functional mobility training;Therapeutic activities;Therapeutic exercise;Balance training;Patient/family education  AM-PAC PT "6 Clicks" Mobility Outcome Measure (Version 2)  Help needed turning from your back to your side while in a flat bed without using bedrails? 4  Help needed moving from lying on your back to sitting on the side of a flat bed without using bedrails? 4  Help needed moving to and from a bed to a  chair (including a wheelchair)? 3  Help needed standing up from a chair using your arms (e.g., wheelchair or bedside chair)?  3  Help needed to walk in hospital room? 3  Help needed climbing 3-5 steps with a railing?  2  6 Click Score 19  Consider Recommendation of Discharge To: Home with [facility]  PT Recommendation  Follow Up Recommendations SNF;Supervision for mobility/OOB  PT equipment Other (comment) (rollator)  Individuals Consulted  Consulted and Agree with Results and Recommendations Patient  Acute Rehab PT Goals  Patient Stated Goal breathe better  PT Goal Formulation With patient  Time For Goal Achievement 03/03/20  Potential to Achieve Goals Good  PT Time Calculation  PT Start Time (ACUTE ONLY) 1340  PT Stop Time (ACUTE ONLY) 1355  PT Time Calculation (min) (ACUTE ONLY) 15 min  PT General Charges  $$ ACUTE PT VISIT 1 Visit  PT Evaluation  $PT Eval Moderate Complexity 1 Mod  Written Expression  Dominant Hand Right   Pt admitted secondary to problem above with deficits above. Pt requiring min guard A for mobility tasks within the room. Pt with very poor tolerance and noted increased SOB and wheezing upon completion of gait within the room. Oxygen sats at 92% on RA following gait. Pt currently does not have anyone to assist and reports he is unsure of where he will d/c. Needs to be independent prior to return home. Feel he would benefit from SNF level therapies to increase independence prior to return home. Will continue to follow acutely.   Farley Ly, PT, DPT  Acute Rehabilitation Services  Pager: [phone] Office: [phone]

## 2020-02-18 NOTE — Progress Notes (Signed)
[facility]  PROGRESS NOTE  Bernis Schreur TDS:[id number] DOB: 08/31/1945 DOA: 02/16/2020 PCP: Patient, No Pcp Per Admit date - 02/16/2020   Admitting Physician Md Pccm, MD  Outpatient Primary MD for the patient is Patient, No Pcp Per  LOS - 2 Brief Narrative   Mcgwire Dasaro is a 74 y.o. year old male with medical history significant for reported asthma who presented on 02/16/2020 with progressive shortness of breath and respiratory distress with O2 sats in the 60s and tachypnea with respiratory rate greater than 40 and conversational dyspnea failed trial of BiPAP due to decreasing consciousness required intubation.   ABG was noted to have mild CO2 of 54 and pH of 7.3 and adequate oxygenation.  UA showed large leukocytes with many bacteria urine culture consistent with E. coli.  Covid test was negative.  CTA was also unremarkable.  Patient was intubated on 9/13   Subjective  Today feels his breathing has improved significantly but still having some difficulty coughing up much of the congestion in his chest.  Denies any chest pain A & P   Acute hypercarbic respiratory failure, presumed secondary to COPD exacerbation given bronchospasms and mild hypercarbia on admission and emphysematous changes on chest x-ray.  Patient was able to be extubated and weaned to nasal cannula with continuation of scheduled DuoNebs and prednisone burst on 9/14.  Patient reports history of asthma for which he only took over-the-counter inhalers.  Currently doing much better only on 2 L nasal cannula still has significant wheezing on exam with no obvious conversational dyspnea  -Continue scheduled DuoNebs given persistent wheezing -Wean O2 to room air, goal SPO2 reading 88% -Oxygen ambulatory testing to determine O2 needs -doxycycline for inflammatory involvement -Incentive spirometer, flutter valve, out of bed to chair -Prednisone burst 10 mg x 5 days  E. coli UTI.  Pansensitive -Continue cefdinir for total  5 days, end date 9/19  Lactic acidosis, resolved Lactic acid of 2.7 on admission.  No bacteremia  Elevated troponin.  On admission troponin 41 likely demand ischemia in the setting of respiratory distress as mentioned above  Acute metabolic encephalopathy, resolved.  In the setting of hypercarbia/infectious etiology    Family Communication  : None  Code Status : Full code  Disposition Plan  :  Patient is from home. Anticipated d/c date:  1-2 days. Barriers to d/c or necessity for inpatient status:  Ability to wean O2, ability to maintain normal respiratory effort without wireman of scheduled duo nebs, patient will also need PCP, PT/OT consulted for disposition Consults  : PCCM  Procedures  : Intubated 9/13, extubated 9/14  DVT Prophylaxis  : Heparin  Lab Results  Component Value Date   PLT 54 (L) 02/17/2020    Diet :  Diet Order            Diet regular Room service appropriate? Yes; Fluid consistency: Thin  Diet effective now                  Inpatient Medications Scheduled Meds: . cefdinir  300 mg Oral Q12H  . chlorhexidine gluconate (MEDLINE KIT)  15 mL Mouth Rinse BID  . Chlorhexidine Gluconate Cloth  6 each Topical Daily  . docusate sodium  100 mg Oral BID  . doxycycline  100 mg Oral Q12H  . guaiFENesin  1,200 mg Oral BID  . heparin  5,000 Units Subcutaneous Q8H  . ipratropium-albuterol  3 mL Nebulization TID  . mouth rinse  15 mL Mouth Rinse BID  .  mupirocin ointment  1 application Nasal BID  . pantoprazole (PROTONIX) IV  40 mg Intravenous QHS  . polyethylene glycol  17 g Oral Daily  . predniSONE  30 mg Oral Q breakfast   Continuous Infusions: . sodium chloride 125 mL/hr at 02/18/20 1606   PRN Meds:.docusate sodium, ipratropium-albuterol, midazolam, polyethylene glycol  Antibiotics  :   Anti-infectives (From admission, onward)   Start     Dose/Rate Route Frequency Ordered Stop   02/17/20 1300  doxycycline (VIBRA-TABS) tablet 100 mg        100 mg Oral  Every 12 hours 02/17/20 1212 02/23/20 0959   02/17/20 1245  cefdinir (OMNICEF) capsule 300 mg        300 mg Oral Every 12 hours 02/17/20 1149 02/22/20 0959   02/17/20 1245  azithromycin (ZITHROMAX) tablet 250 mg  Status:  Discontinued        250 mg Oral Daily 02/17/20 1149 02/17/20 1212   02/16/20 1245  cefTRIAXone (ROCEPHIN) 2 g in sodium chloride 0.9 % 100 mL IVPB  Status:  Discontinued        2 g 200 mL/hr over 30 Minutes Intravenous Every 24 hours 02/16/20 1231 02/17/20 1149       Objective   Vitals:   02/18/20 0747 02/18/20 1115 02/18/20 1529 02/18/20 1630  BP: (!) 145/75 137/80 (!) 156/90   Pulse: 77 82 75   Resp: _0 Temp: 98.1 F (36.7 C) 98.2 F (36.8 C) 98 F (36.7 C)   TempSrc: Oral Oral Oral   SpO2: 94% 100% 100% 97%  Weight:        SpO2: 97 % O2 Flow Rate (L/min): 2 L/min FiO2 (%): 40 %  Wt Readings from Last 3 Encounters:  02/18/20 78.1 kg     Intake/Output Summary (Last 24 hours) at 02/18/2020 1728 Last data filed at 02/18/2020 1606 Gross per 24 hour  Intake 1475.49 ml  Output 1450 ml  Net 25.49 ml    Physical Exam:     Awake Alert, Oriented X 3, Normal affect No new F.N deficits,  [place].AT, Normal respiratory effort on 2 L nasal cannula, significant inspiratory/expiratory wheezing diffusely in all lung fields, no conversational dyspnea RRR,No Gallops,Rubs or new Murmurs,  +ve B.Sounds, Abd Soft, No tenderness, No rebound, guarding or rigidity. No Cyanosis, No new Rash or bruise     I have personally reviewed the following:   Data Reviewed:  CBC Recent Labs  Lab 02/16/20 1057 02/16/20 1138 02/17/20 0143 02/17/20 0201  WBC 3.3*  --  3.9*  --   HGB 12.5* 13.6 11.1* 11.6*  HCT 43.1 40.0 36.3* 34.0*  PLT 62*  --  54*  --   MCV 86.0  --  82.1  --   MCH 25.0*  --  25.1*  --   MCHC 29.0*  --  30.6  --   RDW 17.3*  --  17.2*  --   LYMPHSABS 0.8  --   --   --   MONOABS 0.2  --   --   --   EOSABS 0.1  --   --   --   BASOSABS 0.0   --   --   --     Chemistries  Recent Labs  Lab 02/16/20 1057 02/16/20 1138 02/16/20 1835 02/17/20 0143 02/17/20 0201  NA 143 145 142 143 145  K 3.6 2.9* 3.7 3.9 3.7  CL 105  --  105 108  --   CO2 26  --  27 26  --   GLUCOSE 194*  --  130* 127*  --   BUN 16  --  18 19  --   CREATININE 0.91  --  0.87 0.75  --   CALCIUM 8.9  --  8.7* 8.8*  --   MG  --   --   --  1.8  --   AST 26  --   --   --   --   ALT 16  --   --   --   --   ALKPHOS 103  --   --   --   --   BILITOT 0.5  --   --   --   --    ------------------------------------------------------------------------------------------------------------------ Recent Labs    02/17/20 0143 02/18/20 0224  TRIG 37 62    No results found for: HGBA1C ------------------------------------------------------------------------------------------------------------------ No results for input(s): TSH, T4TOTAL, T3FREE, THYROIDAB in the last 72 hours.  Invalid input(s): FREET3 ------------------------------------------------------------------------------------------------------------------ No results for input(s): VITAMINB12, FOLATE, FERRITIN, TIBC, IRON, RETICCTPCT in the last 72 hours.  Coagulation profile No results for input(s): INR, PROTIME in the last 168 hours.  No results for input(s): DDIMER in the last 72 hours.  Cardiac Enzymes No results for input(s): CKMB, TROPONINI, MYOGLOBIN in the last 168 hours.  Invalid input(s): CK ------------------------------------------------------------------------------------------------------------------ No results found for: BNP  Micro Results Recent Results (from the past 240 hour(s))  SARS Coronavirus 2 by RT PCR (hospital order, performed in [facility] hospital lab) Nasopharyngeal Nasopharyngeal Swab     Status: None   Collection Time: 02/16/20 10:39 AM   Specimen: Nasopharyngeal Swab  Result Value Ref Range Status   SARS Coronavirus 2 NEGATIVE NEGATIVE Final    Comment:  (NOTE) SARS-CoV-2 target nucleic acids are NOT DETECTED.  The SARS-CoV-2 RNA is generally detectable in upper and lower respiratory specimens during the acute phase of infection. The lowest concentration of SARS-CoV-2 viral copies this assay can detect is 250 copies / mL. A negative result does not preclude SARS-CoV-2 infection and should not be used as the sole basis for treatment or other patient management decisions.  A negative result may occur with improper specimen collection / handling, submission of specimen other than nasopharyngeal swab, presence of viral mutation(s) within the areas targeted by this assay, and inadequate number of viral copies (<250 copies / mL). A negative result must be combined with clinical observations, patient history, and epidemiological information.  Fact Sheet for Patients:   StrictlyIdeas.no  Fact Sheet for Healthcare Providers: BankingDealers.co.za  This test is not yet approved or  cleared by the [place] FDA and has been authorized for detection and/or diagnosis of SARS-CoV-2 by FDA under an Emergency Use Authorization (EUA).  This EUA will remain in effect (meaning this test can be used) for the duration of the COVID-19 declaration under Section 564(b)(1) of the Act, 21 U.S.C. section 360bbb-3(b)(1), unless the authorization is terminated or revoked sooner.  Performed at [facility], [place] [address], [place], [place] [address]   Urine culture     Status: Abnormal   Collection Time: 02/16/20 12:42 PM   Specimen: Urine, Random  Result Value Ref Range Status   Specimen Description URINE, RANDOM  Final   Special Requests   Final    NONE Performed at [facility], [place] [address] [place], [place] [address]    Culture >=100,000 COLONIES/mL ESCHERICHIA COLI (A)  Final   Report Status 02/18/2020 FINAL  Final   Organism  ID, Bacteria ESCHERICHIA COLI (A)  Final       Susceptibility   Escherichia coli - MIC*    AMPICILLIN 4 SENSITIVE Sensitive     CEFAZOLIN <=4 SENSITIVE Sensitive     CEFTRIAXONE <=0.25 SENSITIVE Sensitive     CIPROFLOXACIN <=0.25 SENSITIVE Sensitive     GENTAMICIN <=1 SENSITIVE Sensitive     IMIPENEM <=0.25 SENSITIVE Sensitive     NITROFURANTOIN <=16 SENSITIVE Sensitive     TRIMETH/SULFA <=20 SENSITIVE Sensitive     AMPICILLIN/SULBACTAM <=2 SENSITIVE Sensitive     PIP/TAZO <=4 SENSITIVE Sensitive     * >=100,000 COLONIES/mL ESCHERICHIA COLI  Culture, blood (Routine X 2) w Reflex to ID Panel     Status: None (Preliminary result)   Collection Time: 02/16/20  1:30 PM   Specimen: BLOOD RIGHT HAND  Result Value Ref Range Status   Specimen Description BLOOD RIGHT HAND  Final   Special Requests   Final    BOTTLES DRAWN AEROBIC ONLY Blood Culture results may not be optimal due to an inadequate volume of blood received in culture bottles   Culture   Final    NO GROWTH 2 DAYS Performed at [place] [address] [place], [place] [address]    Report Status PENDING  Incomplete  Culture, blood (Routine X 2) w Reflex to ID Panel     Status: None (Preliminary result)   Collection Time: 02/16/20  1:41 PM   Specimen: BLOOD RIGHT HAND  Result Value Ref Range Status   Specimen Description BLOOD RIGHT HAND  Final   Special Requests   Final    BOTTLES DRAWN AEROBIC AND ANAEROBIC Blood Culture results may not be optimal due to an inadequate volume of blood received in culture bottles   Culture   Final    NO GROWTH 2 DAYS Performed at [facility], [place] [address] [place], [place] [address]    Report Status PENDING  Incomplete  MRSA PCR Screening     Status: Abnormal   Collection Time: 02/16/20  9:40 PM   Specimen: Nasopharyngeal  Result Value Ref Range Status   MRSA by PCR POSITIVE (A) NEGATIVE Final    Comment:        The GeneXpert MRSA Assay (FDA approved for NASAL specimens only), is one component of a comprehensive  MRSA colonization surveillance program. It is not intended to diagnose MRSA infection nor to guide or monitor treatment for MRSA infections. RESULT CALLED TO, READ BACK BY AND VERIFIED WITH: R PROCTER RN 02/16/20 AT 2342 SK Performed at [facility], [place] [address], [place], [place] [address]     Radiology Reports CT Head Wo Contrast  Result Date: 02/16/2020 CLINICAL DATA:  Respiratory distress. EXAM: CT HEAD WITHOUT CONTRAST TECHNIQUE: Contiguous axial images were obtained from the base of the skull through the vertex without intravenous contrast. COMPARISON:  None. FINDINGS: Brain: The ventricles are normal in size and configuration. No extra-axial fluid collections are identified. The gray-white differentiation is maintained. No CT findings for acute hemispheric infarction or intracranial hemorrhage. No mass lesions. The brainstem and cerebellum are normal. Vascular: No hyperdense vessels or obvious aneurysm. Skull: No acute skull fracture. No bone lesion. Sinuses/Orbits: The paranasal sinuses and mastoid air cells are clear. The globes are intact. Other: No scalp lesions, laceration or hematoma. IMPRESSION: Normal head CT. Electronically Signed   By: Marijo Sanes M.D.   On: 02/16/2020 12:39   DG Chest [facility] 1 View  Result  Date: 02/17/2020 CLINICAL DATA:  Respiratory failure. EXAM: PORTABLE CHEST 1 VIEW COMPARISON:  02/16/2020 FINDINGS: The endotracheal tube is in good position, unchanged. New NG tube is coursing down the esophagus and into the stomach. The tip is in the fundal region and the proximal port is near the GE junction. This should be advanced several cm. The heart is normal in size. Stable tortuous thoracic aorta. Stable emphysematous changes with hyperinflation but no infiltrates or effusions. No pneumothorax. IMPRESSION: 1. New NG tube tip in the fundus. This should be advanced several cm. 2. Stable endotracheal tube. 3. Stable emphysematous changes and hyperinflation but no  acute pulmonary findings. Electronically Signed   By: Marijo Sanes M.D.   On: 02/17/2020 07:04   DG Chest Portable 1 View  Result Date: 02/16/2020 CLINICAL DATA:  Endotracheal tube placement EXAM: PORTABLE CHEST 1 VIEW COMPARISON:  None. FINDINGS: Endotracheal tube is positioned with tip over the mid trachea, approximately 4.5 cm above the carina. No acute airspace opacity. The heart and mediastinum are unremarkable. IMPRESSION: 1. Endotracheal tube is positioned with tip over the mid trachea, approximately 4.5 cm above the carina. 2. No acute abnormality of the lungs. Electronically Signed   By: Eddie Candle M.D.   On: 02/16/2020 11:05     Time Spent in minutes  30     Desiree Hane M.D on 02/18/2020 at 5:28 PM  To page go to [email or web address] - password [facility]

## 2020-02-18 NOTE — NC FL2 (Signed)
[place] LEVEL OF CARE SCREENING TOOL     IDENTIFICATION  Patient Name: Victor Fitzgerald Birthdate: [birth year] Sex: male Admission Date (Current Location): 02/16/2020  [facility] and [place] Number:  Herbalist and Address:  [facility], [place] [address] [place],  [address]      Provider Number: [id number]  Attending Physician Name and Address:  Desiree Hane, MD  Relative Name and Phone Number:       Current Level of Care: Hospital Recommended Level of Care: [place] Prior Approval Number:    Date Approved/Denied:   PASRR Number: [id number]  Discharge Plan: SNF    Current Diagnoses: Patient Active Problem List   Diagnosis Date Noted  . Acute encephalopathy 02/16/2020    Orientation RESPIRATION BLADDER Height & Weight     Self, Time, Situation, Place  O2 (see d/c summary for oxygen needs) Continent Weight: 78.1 kg Height:     BEHAVIORAL SYMPTOMS/MOOD NEUROLOGICAL BOWEL NUTRITION STATUS      Continent Diet (regular with thin liquids)  AMBULATORY STATUS COMMUNICATION OF NEEDS Skin   Limited Assist Verbally Normal                       Personal Care Assistance Level of Assistance  Bathing, Feeding, Dressing Bathing Assistance: Limited assistance Feeding assistance: Independent Dressing Assistance: Limited assistance     Functional Limitations Info  Sight, Hearing, Speech Sight Info: Adequate Hearing Info: Adequate Speech Info: Adequate    SPECIAL CARE FACTORS FREQUENCY  PT (By licensed PT), OT (By licensed OT)     PT Frequency: 5x/wk OT Frequency: 5x/wk            Contractures Contractures Info: Not present    Additional Factors Info  Code Status, Allergies Code Status Info: full Allergies Info: shellfish           Current Medications (02/18/2020):  This is the current hospital active medication list Current Facility-Administered Medications  Medication Dose Route  Frequency Provider Last Rate Last Admin  . 0.9 %  sodium chloride infusion   Intravenous Continuous Lacretia Leigh, MD 125 mL/hr at 02/18/20 0608 New Bag at 02/18/20 9379  . cefdinir (OMNICEF) capsule 300 mg  300 mg Oral Q12H Olalere, Adewale A, MD   300 mg at 02/18/20 1151  . chlorhexidine gluconate (MEDLINE KIT) (PERIDEX) 0.12 % solution 15 mL  15 mL Mouth Rinse BID Collene Gobble, MD   15 mL at 02/17/20 2155  . Chlorhexidine Gluconate Cloth 2 % PADS 6 each  6 each Topical Daily Collene Gobble, MD   6 each at 02/17/20 1205  . docusate sodium (COLACE) capsule 100 mg  100 mg Oral BID Collene Gobble, MD   100 mg at 02/18/20 1152  . docusate sodium (COLACE) capsule 100 mg  100 mg Oral BID PRN Collene Gobble, MD      . doxycycline (VIBRA-TABS) tablet 100 mg  100 mg Oral Q12H Olalere, Adewale A, MD   100 mg at 02/18/20 1149  . guaiFENesin (MUCINEX) 12 hr tablet 1,200 mg  1,200 mg Oral BID Oretha Milch D, MD   1,200 mg at 02/18/20 1151  . heparin injection 5,000 Units  5,000 Units Subcutaneous Q8H Desai, Rahul P, PA-C   5,000 Units at 02/18/20 0240  . ipratropium-albuterol (DUONEB) 0.5-2.5 (3) MG/3ML nebulizer solution 3 mL  3 mL Nebulization TID Collene Gobble, MD   3 mL at 02/18/20  [email or web address]  . ipratropium-albuterol (DUONEB) 0.5-2.5 (3) MG/3ML nebulizer solution 3 mL  3 mL Nebulization Q4H PRN Collene Gobble, MD   3 mL at 02/17/20 1946  . MEDLINE mouth rinse  15 mL Mouth Rinse BID Olalere, Adewale A, MD   15 mL at 02/17/20 2156  . midazolam (VERSED) injection 1-2 mg  1-2 mg Intravenous Q2H PRN Desai, Rahul P, PA-C      . mupirocin ointment (BACTROBAN) 2 % 1 application  1 application Nasal BID Collene Gobble, MD   1 application at 07/61/51 1154  . pantoprazole (PROTONIX) injection 40 mg  40 mg Intravenous QHS Desai, Rahul P, PA-C   40 mg at 02/17/20 2157  . polyethylene glycol (MIRALAX / GLYCOLAX) packet 17 g  17 g Oral Daily PRN Desai, Rahul P, PA-C      . polyethylene glycol (MIRALAX / GLYCOLAX)  packet 17 g  17 g Oral Daily Collene Gobble, MD   17 g at 02/18/20 1149  . predniSONE (DELTASONE) tablet 30 mg  30 mg Oral Q breakfast Olalere, Adewale A, MD   30 mg at 02/18/20 1151     Discharge Medications: Please see discharge summary for a list of discharge medications.  Relevant Imaging Results:  Relevant Lab Results:   Additional Information SS#: [id number]  Pollie Friar, RN

## 2020-02-19 ENCOUNTER — Inpatient Hospital Stay (HOSPITAL_COMMUNITY): Payer: Medicare Other

## 2020-02-19 DIAGNOSIS — R109 Unspecified abdominal pain: Secondary | ICD-10-CM

## 2020-02-19 DIAGNOSIS — K4090 Unilateral inguinal hernia, without obstruction or gangrene, not specified as recurrent: Secondary | ICD-10-CM

## 2020-02-19 DIAGNOSIS — R1031 Right lower quadrant pain: Secondary | ICD-10-CM

## 2020-02-19 LAB — TRIGLYCERIDES: Triglycerides: 66 mg/dL (ref <150)

## 2020-02-19 MED ORDER — IPRATROPIUM-ALBUTEROL 0.5-2.5 (3) MG/3ML IN SOLN
RESPIRATORY_TRACT | Status: AC
  Administered 2020-02-19: 3 mL via RESPIRATORY_TRACT
  Filled 2020-02-19: qty 3

## 2020-02-19 MED ORDER — IPRATROPIUM-ALBUTEROL 0.5-2.5 (3) MG/3ML IN SOLN
3.0000 mL | Freq: Two times a day (BID) | RESPIRATORY_TRACT | Status: DC
  Administered 2020-02-20: 3 mL via RESPIRATORY_TRACT
  Filled 2020-02-19 (×2): qty 3

## 2020-02-19 MED ORDER — IOHEXOL 9 MG/ML PO SOLN
500.0000 mL | ORAL | Status: AC
  Administered 2020-02-19 (×2): 500 mL via ORAL

## 2020-02-19 MED ORDER — IOHEXOL 300 MG/ML  SOLN
100.0000 mL | Freq: Once | INTRAMUSCULAR | Status: AC | PRN
  Administered 2020-02-19: 100 mL via INTRAVENOUS

## 2020-02-19 NOTE — Progress Notes (Signed)
[facility]  PROGRESS NOTE  Victor Fitzgerald FWY:[id number] DOB: 1946/05/14 DOA: 02/16/2020 PCP: Patient, No Pcp Per Admit date - 02/16/2020   Admitting Physician Md Pccm, MD  Outpatient Primary MD for the patient is Patient, No Pcp Per  LOS - 3 Brief Narrative   Victor Fitzgerald is a 74 y.o. year old male with medical history significant for reported asthma who presented on 02/16/2020 with progressive shortness of breath and respiratory distress with O2 sats in the 60s and tachypnea with respiratory rate greater than 40 and conversational dyspnea failed trial of BiPAP due to decreasing consciousness required intubation.   ABG was noted to have mild CO2 of 54 and pH of 7.3 and adequate oxygenation.  UA showed large leukocytes with many bacteria urine culture consistent with E. coli.  Covid test was negative.  CTA was also unremarkable.  Patient was intubated on 9/13   Subjective  Today says his breathing is stable.  Tolerating incentive spirometer well.  Was more concerned about his hernia in his right groin.  He states there is pain present and has been ongoing for several weeks. A & P   Acute hypercarbic respiratory failure, presumed secondary to COPD exacerbation given bronchospasms and mild hypercarbia on admission and emphysematous changes on chest x-ray.  Patient was able to be extubated and weaned to nasal cannula with continuation of scheduled DuoNebs and prednisone burst on 9/14.  Patient reports history of asthma for which he only took over-the-counter inhalers.  Currently doing much better and off supplemental O2  still has significant wheezing on exam with no obvious conversational dyspnea  -Continue scheduled DuoNebs given persistent wheezing -doxycycline for inflammatory involvement -Incentive spirometer, flutter valve, out of bed to chair -Prednisone burst 10 mg x 5 days  Right groin hernia, unclear femoral or inguinal in nature.  Reports persistent pain at site, seems  somewhat reducible on my examination no clear signs of strangulation -We will obtain stat CT of groin to ensure no bowel obstruction/strangulation/incarceration -Continue to closely monitor  E. coli UTI.  Pansensitive -Continue cefdinir for total 5 days, end date 9/19  Lactic acidosis, resolved Lactic acid of 2.7 on admission.  No bacteremia  Elevated troponin.  On admission troponin 41 likely demand ischemia in the setting of respiratory distress as mentioned above  Acute metabolic encephalopathy, resolved.  In the setting of hypercarbia/infectious etiology    Family Communication  : None  Code Status : Full code  Disposition Plan  :  Patient is from home. Anticipated d/c date:  1-2 days to SNF to recommend continued rehabilitation for physical dependence per PT evaluation recommendations barriers to d/c or necessity for inpatient status:  TD maintain normal respiratory effort without scheduled duo nebs, and the results of stat CT abdomen/groin Consults  : PCCM  Procedures  : Intubated 9/13, extubated 9/14  DVT Prophylaxis  : Heparin  Lab Results  Component Value Date   PLT 54 (L) 02/17/2020    Diet :  Diet Order            Diet regular Room service appropriate? Yes; Fluid consistency: Thin  Diet effective now                  Inpatient Medications Scheduled Meds:  cefdinir  300 mg Oral Q12H   chlorhexidine gluconate (MEDLINE KIT)  15 mL Mouth Rinse BID   Chlorhexidine Gluconate Cloth  6 each Topical Daily   docusate sodium  100 mg Oral BID   doxycycline  100 mg Oral Q12H   guaiFENesin  1,200 mg Oral BID   heparin  5,000 Units Subcutaneous Q8H   ipratropium-albuterol  3 mL Nebulization BID   mouth rinse  15 mL Mouth Rinse BID   mupirocin ointment  1 application Nasal BID   polyethylene glycol  17 g Oral Daily   predniSONE  30 mg Oral Q breakfast   Continuous Infusions:  PRN Meds:.docusate sodium, ipratropium-albuterol, midazolam, polyethylene  glycol  Antibiotics  :   Anti-infectives (From admission, onward)   Start     Dose/Rate Route Frequency Ordered Stop   02/17/20 1300  doxycycline (VIBRA-TABS) tablet 100 mg        100 mg Oral Every 12 hours 02/17/20 1212 02/23/20 0959   02/17/20 1245  cefdinir (OMNICEF) capsule 300 mg        300 mg Oral Every 12 hours 02/17/20 1149 02/22/20 0959   02/17/20 1245  azithromycin (ZITHROMAX) tablet 250 mg  Status:  Discontinued        250 mg Oral Daily 02/17/20 1149 02/17/20 1212   02/16/20 1245  cefTRIAXone (ROCEPHIN) 2 g in sodium chloride 0.9 % 100 mL IVPB  Status:  Discontinued        2 g 200 mL/hr over 30 Minutes Intravenous Every 24 hours 02/16/20 1231 02/17/20 1149       Objective   Vitals:   02/18/20 2314 02/19/20 0413 02/19/20 0800 02/19/20 1153  BP: 134/84 (!) 150/91 (!) 154/87 (!) 148/83  Pulse: 90 68 76 61  Resp: '18 17 18 18  ' Temp: 98.4 F (36.9 C) 98.5 F (36.9 C) 98.4 F (36.9 C) 97.9 F (36.6 C)  TempSrc:  Oral Oral Oral  SpO2: 100% 99% 98% 98%  Weight:        SpO2: 98 % O2 Flow Rate (L/min): 2 L/min FiO2 (%): 40 %  Wt Readings from Last 3 Encounters:  02/18/20 78.1 kg     Intake/Output Summary (Last 24 hours) at 02/19/2020 1416 Last data filed at 02/19/2020 1330 Gross per 24 hour  Intake 463.02 ml  Output 4050 ml  Net -3586.98 ml    Physical Exam:     Awake Alert, Oriented X 3, Normal affect No new F.N deficits,  [place].AT, Normal respiratory effort on room air, significant inspiratory/expiratory wheezing diffusely in all lung fields (slightly improved from yesterday), no conversational dyspnea RRR,No Gallops,Rubs or new Murmurs,  +ve B.Sounds, Abd Soft, No tenderness, No rebound, guarding or rigidity. Obvious mass in right groin down to the scrotum, scrotum is soft, nontender, nonerythematous, right groin mass is also soft and seems to be reducible consistent with hernia, no rigidity, no rebound tenderness No Cyanosis, No new Rash or bruise      I have personally reviewed the following:   Data Reviewed:  CBC Recent Labs  Lab 02/16/20 1057 02/16/20 1138 02/17/20 0143 02/17/20 0201  WBC 3.3*  --  3.9*  --   HGB 12.5* 13.6 11.1* 11.6*  HCT 43.1 40.0 36.3* 34.0*  PLT 62*  --  54*  --   MCV 86.0  --  82.1  --   MCH 25.0*  --  25.1*  --   MCHC 29.0*  --  30.6  --   RDW 17.3*  --  17.2*  --   LYMPHSABS 0.8  --   --   --   MONOABS 0.2  --   --   --   EOSABS 0.1  --   --   --  BASOSABS 0.0  --   --   --     Chemistries  Recent Labs  Lab 02/16/20 1057 02/16/20 1138 02/16/20 1835 02/17/20 0143 02/17/20 0201  NA 143 145 142 143 145  K 3.6 2.9* 3.7 3.9 3.7  CL 105  --  105 108  --   CO2 26  --  27 26  --   GLUCOSE 194*  --  130* 127*  --   BUN 16  --  18 19  --   CREATININE 0.91  --  0.87 0.75  --   CALCIUM 8.9  --  8.7* 8.8*  --   MG  --   --   --  1.8  --   AST 26  --   --   --   --   ALT 16  --   --   --   --   ALKPHOS 103  --   --   --   --   BILITOT 0.5  --   --   --   --    ------------------------------------------------------------------------------------------------------------------ Recent Labs    02/18/20 0224 02/19/20 0846  TRIG 62 66    No results found for: HGBA1C ------------------------------------------------------------------------------------------------------------------ No results for input(s): TSH, T4TOTAL, T3FREE, THYROIDAB in the last 72 hours.  Invalid input(s): FREET3 ------------------------------------------------------------------------------------------------------------------ No results for input(s): VITAMINB12, FOLATE, FERRITIN, TIBC, IRON, RETICCTPCT in the last 72 hours.  Coagulation profile No results for input(s): INR, PROTIME in the last 168 hours.  No results for input(s): DDIMER in the last 72 hours.  Cardiac Enzymes No results for input(s): CKMB, TROPONINI, MYOGLOBIN in the last 168 hours.  Invalid input(s):  CK ------------------------------------------------------------------------------------------------------------------ No results found for: BNP  Micro Results Recent Results (from the past 240 hour(s))  SARS Coronavirus 2 by RT PCR (hospital order, performed in [facility] hospital lab) Nasopharyngeal Nasopharyngeal Swab     Status: None   Collection Time: 02/16/20 10:39 AM   Specimen: Nasopharyngeal Swab  Result Value Ref Range Status   SARS Coronavirus 2 NEGATIVE NEGATIVE Final    Comment: (NOTE) SARS-CoV-2 target nucleic acids are NOT DETECTED.  The SARS-CoV-2 RNA is generally detectable in upper and lower respiratory specimens during the acute phase of infection. The lowest concentration of SARS-CoV-2 viral copies this assay can detect is 250 copies / mL. A negative result does not preclude SARS-CoV-2 infection and should not be used as the sole basis for treatment or other patient management decisions.  A negative result may occur with improper specimen collection / handling, submission of specimen other than nasopharyngeal swab, presence of viral mutation(s) within the areas targeted by this assay, and inadequate number of viral copies (<250 copies / mL). A negative result must be combined with clinical observations, patient history, and epidemiological information.  Fact Sheet for Patients:   StrictlyIdeas.no  Fact Sheet for Healthcare Providers: BankingDealers.co.za  This test is not yet approved or  cleared by the [place] FDA and has been authorized for detection and/or diagnosis of SARS-CoV-2 by FDA under an Emergency Use Authorization (EUA).  This EUA will remain in effect (meaning this test can be used) for the duration of the COVID-19 declaration under Section 564(b)(1) of the Act, 21 U.S.C. section 360bbb-3(b)(1), unless the authorization is terminated or revoked sooner.  Performed at [facility],  [place] [address] [place],  [address]   Urine culture     Status: Abnormal   Collection Time: 02/16/20 12:42 PM   Specimen:  Urine, Random  Result Value Ref Range Status   Specimen Description URINE, RANDOM  Final   Special Requests   Final    NONE Performed at [facility], [address] [place], [place] [address]    Culture >=100,000 COLONIES/mL ESCHERICHIA COLI (A)  Final   Report Status 02/18/2020 FINAL  Final   Organism ID, Bacteria ESCHERICHIA COLI (A)  Final      Susceptibility   Escherichia coli - MIC*    AMPICILLIN 4 SENSITIVE Sensitive     CEFAZOLIN <=4 SENSITIVE Sensitive     CEFTRIAXONE <=0.25 SENSITIVE Sensitive     CIPROFLOXACIN <=0.25 SENSITIVE Sensitive     GENTAMICIN <=1 SENSITIVE Sensitive     IMIPENEM <=0.25 SENSITIVE Sensitive     NITROFURANTOIN <=16 SENSITIVE Sensitive     TRIMETH/SULFA <=20 SENSITIVE Sensitive     AMPICILLIN/SULBACTAM <=2 SENSITIVE Sensitive     PIP/TAZO <=4 SENSITIVE Sensitive     * >=100,000 COLONIES/mL ESCHERICHIA COLI  Culture, blood (Routine X 2) w Reflex to ID Panel     Status: None (Preliminary result)   Collection Time: 02/16/20  1:30 PM   Specimen: BLOOD RIGHT HAND  Result Value Ref Range Status   Specimen Description BLOOD RIGHT HAND  Final   Special Requests   Final    BOTTLES DRAWN AEROBIC ONLY Blood Culture results may not be optimal due to an inadequate volume of blood received in culture bottles   Culture   Final    NO GROWTH 2 DAYS Performed at [place] [address] [place], [place] [address]    Report Status PENDING  Incomplete  Culture, blood (Routine X 2) w Reflex to ID Panel     Status: None (Preliminary result)   Collection Time: 02/16/20  1:41 PM   Specimen: BLOOD RIGHT HAND  Result Value Ref Range Status   Specimen Description BLOOD RIGHT HAND  Final   Special Requests   Final    BOTTLES DRAWN AEROBIC AND ANAEROBIC Blood Culture results may not be optimal due to an inadequate volume of  blood received in culture bottles   Culture   Final    NO GROWTH 2 DAYS Performed at [facility], [place] [address] [place], [place] [address]    Report Status PENDING  Incomplete  MRSA PCR Screening     Status: Abnormal   Collection Time: 02/16/20  9:40 PM   Specimen: Nasopharyngeal  Result Value Ref Range Status   MRSA by PCR POSITIVE (A) NEGATIVE Final    Comment:        The GeneXpert MRSA Assay (FDA approved for NASAL specimens only), is one component of a comprehensive MRSA colonization surveillance program. It is not intended to diagnose MRSA infection nor to guide or monitor treatment for MRSA infections. RESULT CALLED TO, READ BACK BY AND VERIFIED WITH: R PROCTER RN 02/16/20 AT 2342 SK Performed at [facility], [place] [address] [place], [place] [address]     Radiology Reports CT Head Wo Contrast  Result Date: 02/16/2020 CLINICAL DATA:  Respiratory distress. EXAM: CT HEAD WITHOUT CONTRAST TECHNIQUE: Contiguous axial images were obtained from the base of the skull through the vertex without intravenous contrast. COMPARISON:  None. FINDINGS: Brain: The ventricles are normal in size and configuration. No extra-axial fluid collections are identified. The gray-white differentiation is maintained. No CT findings for acute hemispheric infarction or intracranial hemorrhage. No mass lesions. The brainstem and cerebellum are normal. Vascular: No  hyperdense vessels or obvious aneurysm. Skull: No acute skull fracture. No bone lesion. Sinuses/Orbits: The paranasal sinuses and mastoid air cells are clear. The globes are intact. Other: No scalp lesions, laceration or hematoma. IMPRESSION: Normal head CT. Electronically Signed   By: Marijo Sanes M.D.   On: 02/16/2020 12:39   DG Chest Port 1 View  Result Date: 02/17/2020 CLINICAL DATA:  Respiratory failure. EXAM: PORTABLE CHEST 1 VIEW COMPARISON:  02/16/2020 FINDINGS: The endotracheal tube is in good position, unchanged. New NG  tube is coursing down the esophagus and into the stomach. The tip is in the fundal region and the proximal port is near the GE junction. This should be advanced several cm. The heart is normal in size. Stable tortuous thoracic aorta. Stable emphysematous changes with hyperinflation but no infiltrates or effusions. No pneumothorax. IMPRESSION: 1. New NG tube tip in the fundus. This should be advanced several cm. 2. Stable endotracheal tube. 3. Stable emphysematous changes and hyperinflation but no acute pulmonary findings. Electronically Signed   By: Marijo Sanes M.D.   On: 02/17/2020 07:04   DG Chest Portable 1 View  Result Date: 02/16/2020 CLINICAL DATA:  Endotracheal tube placement EXAM: PORTABLE CHEST 1 VIEW COMPARISON:  None. FINDINGS: Endotracheal tube is positioned with tip over the mid trachea, approximately 4.5 cm above the carina. No acute airspace opacity. The heart and mediastinum are unremarkable. IMPRESSION: 1. Endotracheal tube is positioned with tip over the mid trachea, approximately 4.5 cm above the carina. 2. No acute abnormality of the lungs. Electronically Signed   By: Eddie Candle M.D.   On: 02/16/2020 11:05     Time Spent in minutes  30     Desiree Hane M.D on 02/19/2020 at 2:16 PM  To page go to [email or web address] - password [facility]

## 2020-02-20 DIAGNOSIS — D61818 Other pancytopenia: Secondary | ICD-10-CM

## 2020-02-20 LAB — TRIGLYCERIDES: Triglycerides: 67 mg/dL (ref <150)

## 2020-02-20 LAB — PROTIME-INR
INR: 1.1 (ref 0.8–1.2)
Prothrombin Time: 14 seconds (ref 11.4–15.2)

## 2020-02-20 LAB — HIV ANTIBODY (ROUTINE TESTING W REFLEX): HIV Screen 4th Generation wRfx: NONREACTIVE

## 2020-02-20 LAB — TECHNOLOGIST SMEAR REVIEW

## 2020-02-20 LAB — SAVE SMEAR(SSMR), FOR PROVIDER SLIDE REVIEW

## 2020-02-20 NOTE — TOC Progression Note (Signed)
Transition of Care [facility]) - Progression Note    Patient Details  Name: Victor Fitzgerald MRN: [id number] Date of Birth: 1945/06/06  [facility] CM/SW Contact  Kermit Balo, RN Phone Number: 02/20/2020, 3:27 PM  Clinical Narrative:    Pt interested in going to [facility] for SNF rehab. CM is waiting to hear back from their admissions person to see if they can offer a bed.  TOC following.   Expected Discharge Plan: Homeless Shelter Barriers to Discharge: Continued Medical Work up  Expected Discharge Plan and Services Expected Discharge Plan: Homeless Shelter   Discharge Planning Services: CM Consult   Living arrangements for the past 2 months: Hotel/Motel (But has check out and states has no where to go)                                       Social Determinants of Health (SDOH) Interventions    Readmission Risk Interventions No flowsheet data found.

## 2020-02-20 NOTE — Care Management Important Message (Signed)
Important Message  Patient Details  Name: Victor Fitzgerald MRN: [id number] Date of Birth: [birth year]   Medicare Important Message Given:  Yes - Important Message mailed due to current National Emergency  Verbal consent obtained due to current National Emergency  Relationship to patient: Self Contact Name: Victor Fitzgerald Call Date: 02/20/20  Time: 1143 Phone: [phone] Outcome: Spoke with contact Important Message mailed to: Other (must enter comment) (patient declined additional copy of IM)    Victor Fitzgerald Victor Fitzgerald 02/20/2020, 11:45 AM

## 2020-02-20 NOTE — Progress Notes (Addendum)
[facility]  PROGRESS NOTE  Victor Fitzgerald ZOX:[id number] DOB: 12-24-1945 DOA: 02/16/2020 PCP: Patient, No Pcp Per Admit date - 02/16/2020   Admitting Physician No admitting provider for patient encounter.  Outpatient Primary MD for the patient is Patient, No Pcp Per  LOS - 4 Brief Narrative   Victor Fitzgerald is a 74 y.o. year old male with medical history significant for reported asthma who presented on 02/16/2020 with progressive shortness of breath and respiratory distress with O2 sats in the 60s and tachypnea with respiratory rate greater than 40 and conversational dyspnea failed trial of BiPAP due to decreasing consciousness required intubation.   ABG was noted to have mild CO2 of 54 and pH of 7.3 and adequate oxygenation.  UA showed large leukocytes with many bacteria urine culture consistent with E. coli.  Covid test was negative.  CTA was also unremarkable.  Patient was intubated on 9/13   Subjective  Today continues to report improvement in breathing.  Still has occasional pain from groin, tried to have BM.  No nausea or vomiting, tolerating diet. A & P   Acute hypercarbic respiratory failure, resolved.  Presumed secondary to COPD exacerbation given bronchospasms and mild hypercarbia on admission and emphysematous changes on chest x-ray.  Patient was able to be extubated and weaned to nasal cannula with continuation of scheduled DuoNebs and prednisone burst on 9/14.  Patient reports history of asthma for which he only took over-the-counter inhalers.  Currently doing much better and off supplemental O2 only occasional wheezing with no conversational dyspnea.   -Wheezing much improved, de-escalate #to as needed use -doxycycline for inflammatory involvement -Incentive spirometer, flutter valve, out of bed to chair -Prednisone burst 10 mg x 5 days  Large right inguinal hernia,.  No signs of strangulation or incarceration on exam or CT.  Continues to be reducible.  Discussed case with  surgery who recommends supportive care (hernia truss), given large size at lower risk for incarceration -Referral placed with Dr. Derrell Lolling to discuss elective hernia repair as outpatient -continue to monitor, scrotal support while inpatient -Advised use of hernia truss/belt on discharge  E. coli UTI.  Pansensitive -Continue cefdinir for total 5 days, end date 9/19  Pancytopenia seems new,  stable. Last documented CBC from 2015 wnl. Hgb 11.6 and normocytic, platelets 54, and WBC 3.9. no localizing signs or symptoms of bleeding or bruising -check peripheral smear, HIV, HCV/hepatitis panel, INR, check LFTs - daily CBC  Lactic acidosis, resolved Lactic acid of 2.7 on admission.  No bacteremia  Elevated troponin.  On admission troponin 41 likely demand ischemia in the setting of respiratory distress as mentioned above  Acute metabolic encephalopathy, resolved.  In the setting of hypercarbia/infectious etiology    Family Communication  : None  Code Status : Full code  Disposition Plan  :  Patient is from home. Anticipated d/c date:  1-2 days to SNF to recommend continued rehabilitation for physical dependence per PT evaluation recommendations barriers to d/c or necessity for inpatient status:  Monitor respiratory status on as needed inhalers, monitor hernia with supportive care, remained stable anticipate discharge to SNF once available next 24 to 48 hours Consults  : PCCM  Procedures  : Intubated 9/13, extubated 9/14  DVT Prophylaxis  : Heparin  Lab Results  Component Value Date   PLT 54 (L) 02/17/2020    Diet :  Diet Order            Diet regular Room service appropriate? Yes; Fluid consistency: Thin  Diet effective now                  Inpatient Medications Scheduled Meds: . cefdinir  300 mg Oral Q12H  . Chlorhexidine Gluconate Cloth  6 each Topical Daily  . docusate sodium  100 mg Oral BID  . doxycycline  100 mg Oral Q12H  . guaiFENesin  1,200 mg Oral BID  . heparin   5,000 Units Subcutaneous Q8H  . ipratropium-albuterol  3 mL Nebulization BID  . mouth rinse  15 mL Mouth Rinse BID  . mupirocin ointment  1 application Nasal BID  . polyethylene glycol  17 g Oral Daily  . predniSONE  30 mg Oral Q breakfast   Continuous Infusions:  PRN Meds:.docusate sodium, ipratropium-albuterol, midazolam, polyethylene glycol  Antibiotics  :   Anti-infectives (From admission, onward)   Start     Dose/Rate Route Frequency Ordered Stop   02/17/20 1300  doxycycline (VIBRA-TABS) tablet 100 mg        100 mg Oral Every 12 hours 02/17/20 1212 02/23/20 0959   02/17/20 1245  cefdinir (OMNICEF) capsule 300 mg        300 mg Oral Every 12 hours 02/17/20 1149 02/22/20 0959   02/17/20 1245  azithromycin (ZITHROMAX) tablet 250 mg  Status:  Discontinued        250 mg Oral Daily 02/17/20 1149 02/17/20 1212   02/16/20 1245  cefTRIAXone (ROCEPHIN) 2 g in sodium chloride 0.9 % 100 mL IVPB  Status:  Discontinued        2 g 200 mL/hr over 30 Minutes Intravenous Every 24 hours 02/16/20 1231 02/17/20 1149       Objective   Vitals:   02/20/20 0342 02/20/20 0751 02/20/20 0801 02/20/20 1217  BP: 133/89 (!) 150/98  138/85  Pulse: 70 76 78 72  Resp: Temp: 98.1 F (36.7 C) 98.5 F (36.9 C)  98.5 F (36.9 C)  TempSrc: Oral Oral  Oral  SpO2: 97% 99% 94% 100%  Weight:        SpO2: 100 % O2 Flow Rate (L/min): 2 L/min FiO2 (%): 40 %  Wt Readings from Last 3 Encounters:  02/18/20 78.1 kg     Intake/Output Summary (Last 24 hours) at 02/20/2020 1516 Last data filed at 02/20/2020 1500 Gross per 24 hour  Intake 360 ml  Output 2800 ml  Net -2440 ml    Physical Exam:     Awake Alert, Oriented X 3, Normal affect No new F.N deficits,  [place].AT, Normal respiratory effort on room air, occasional wheezing no conversational dyspnea RRR,No Gallops,Rubs or new Murmurs,  +ve B.Sounds, Abd Soft, No tenderness, No rebound, guarding or rigidity. Right inguinal hernia  present, soft, reducible, slightly tender with palpation, no guarding, scrotal remains soft No Cyanosis, No new Rash or bruise     I have personally reviewed the following:   Data Reviewed:  CBC Recent Labs  Lab 02/16/20 1057 02/16/20 1138 02/17/20 0143 02/17/20 0201  WBC 3.3*  --  3.9*  --   HGB 12.5* 13.6 11.1* 11.6*  HCT 43.1 40.0 36.3* 34.0*  PLT 62*  --  54*  --   MCV 86.0  --  82.1  --   MCH 25.0*  --  25.1*  --   MCHC 29.0*  --  30.6  --   RDW 17.3*  --  17.2*  --   LYMPHSABS 0.8  --   --   --   MONOABS  0.2  --   --   --   EOSABS 0.1  --   --   --   BASOSABS 0.0  --   --   --     Chemistries  Recent Labs  Lab 02/16/20 1057 02/16/20 1138 02/16/20 1835 02/17/20 0143 02/17/20 0201  NA 143 145 142 143 145  K 3.6 2.9* 3.7 3.9 3.7  CL 105  --  105 108  --   CO2 26  --  27 26  --   GLUCOSE 194*  --  130* 127*  --   BUN 16  --  18 19  --   CREATININE 0.91  --  0.87 0.75  --   CALCIUM 8.9  --  8.7* 8.8*  --   MG  --   --   --  1.8  --   AST 26  --   --   --   --   ALT 16  --   --   --   --   ALKPHOS 103  --   --   --   --   BILITOT 0.5  --   --   --   --    ------------------------------------------------------------------------------------------------------------------ Recent Labs    02/19/20 0846 02/20/20 0133  TRIG 66 67    No results found for: HGBA1C ------------------------------------------------------------------------------------------------------------------ No results for input(s): TSH, T4TOTAL, T3FREE, THYROIDAB in the last 72 hours.  Invalid input(s): FREET3 ------------------------------------------------------------------------------------------------------------------ No results for input(s): VITAMINB12, FOLATE, FERRITIN, TIBC, IRON, RETICCTPCT in the last 72 hours.  Coagulation profile No results for input(s): INR, PROTIME in the last 168 hours.  No results for input(s): DDIMER in the last 72 hours.  Cardiac Enzymes No results  for input(s): CKMB, TROPONINI, MYOGLOBIN in the last 168 hours.  Invalid input(s): CK ------------------------------------------------------------------------------------------------------------------ No results found for: BNP  Micro Results Recent Results (from the past 240 hour(s))  SARS Coronavirus 2 by RT PCR (hospital order, performed in [facility] hospital lab) Nasopharyngeal Nasopharyngeal Swab     Status: None   Collection Time: 02/16/20 10:39 AM   Specimen: Nasopharyngeal Swab  Result Value Ref Range Status   SARS Coronavirus 2 NEGATIVE NEGATIVE Final    Comment: (NOTE) SARS-CoV-2 target nucleic acids are NOT DETECTED.  The SARS-CoV-2 RNA is generally detectable in upper and lower respiratory specimens during the acute phase of infection. The lowest concentration of SARS-CoV-2 viral copies this assay can detect is 250 copies / mL. A negative result does not preclude SARS-CoV-2 infection and should not be used as the sole basis for treatment or other patient management decisions.  A negative result may occur with improper specimen collection / handling, submission of specimen other than nasopharyngeal swab, presence of viral mutation(s) within the areas targeted by this assay, and inadequate number of viral copies (<250 copies / mL). A negative result must be combined with clinical observations, patient history, and epidemiological information.  Fact Sheet for Patients:   BoilerBrush.com.cy  Fact Sheet for Healthcare Providers: https://pope.com/  This test is not yet approved or  cleared by the [place] FDA and has been authorized for detection and/or diagnosis of SARS-CoV-2 by FDA under an Emergency Use Authorization (EUA).  This EUA will remain in effect (meaning this test can be used) for the duration of the COVID-19 declaration under Section 564(b)(1) of the Act, 21 U.S.C. section 360bbb-3(b)(1), unless the  authorization is terminated or revoked sooner.  Performed at [facility], [address]  [place], [place] [address]   Urine culture     Status: Abnormal   Collection Time: 02/16/20 12:42 PM   Specimen: Urine, Random  Result Value Ref Range Status   Specimen Description URINE, RANDOM  Final   Special Requests   Final    NONE Performed at [facility], [address] [place], [place] [address]    Culture >=100,000 COLONIES/mL ESCHERICHIA COLI (A)  Final   Report Status 02/18/2020 FINAL  Final   Organism ID, Bacteria ESCHERICHIA COLI (A)  Final      Susceptibility   Escherichia coli - MIC*    AMPICILLIN 4 SENSITIVE Sensitive     CEFAZOLIN <=4 SENSITIVE Sensitive     CEFTRIAXONE <=0.25 SENSITIVE Sensitive     CIPROFLOXACIN <=0.25 SENSITIVE Sensitive     GENTAMICIN <=1 SENSITIVE Sensitive     IMIPENEM <=0.25 SENSITIVE Sensitive     NITROFURANTOIN <=16 SENSITIVE Sensitive     TRIMETH/SULFA <=20 SENSITIVE Sensitive     AMPICILLIN/SULBACTAM <=2 SENSITIVE Sensitive     PIP/TAZO <=4 SENSITIVE Sensitive     * >=100,000 COLONIES/mL ESCHERICHIA COLI  Culture, blood (Routine X 2) w Reflex to ID Panel     Status: None (Preliminary result)   Collection Time: 02/16/20  1:30 PM   Specimen: BLOOD RIGHT HAND  Result Value Ref Range Status   Specimen Description BLOOD RIGHT HAND  Final   Special Requests   Final    BOTTLES DRAWN AEROBIC ONLY Blood Culture results may not be optimal due to an inadequate volume of blood received in culture bottles   Culture   Final    NO GROWTH 4 DAYS Performed at [facility], [address] [place], [place] [address]    Report Status PENDING  Incomplete  Culture, blood (Routine X 2) w Reflex to ID Panel     Status: None (Preliminary result)   Collection Time: 02/16/20  1:41 PM   Specimen: BLOOD RIGHT HAND  Result Value Ref Range Status   Specimen Description BLOOD RIGHT HAND  Final   Special Requests   Final    BOTTLES DRAWN AEROBIC  AND ANAEROBIC Blood Culture results may not be optimal due to an inadequate volume of blood received in culture bottles   Culture   Final    NO GROWTH 4 DAYS Performed at [facility], [address] [place], [place] [address]    Report Status PENDING  Incomplete  MRSA PCR Screening     Status: Abnormal   Collection Time: 02/16/20  9:40 PM   Specimen: Nasopharyngeal  Result Value Ref Range Status   MRSA by PCR POSITIVE (A) NEGATIVE Final    Comment:        The GeneXpert MRSA Assay (FDA approved for NASAL specimens only), is one component of a comprehensive MRSA colonization surveillance program. It is not intended to diagnose MRSA infection nor to guide or monitor treatment for MRSA infections. RESULT CALLED TO, READ BACK BY AND VERIFIED WITH: R PROCTER RN 02/16/20 AT 2342 SK Performed at [facility], [address] [place], [place] [address]     Radiology Reports CT Head Wo Contrast  Result Date: 02/16/2020 CLINICAL DATA:  Respiratory distress. EXAM: CT HEAD WITHOUT CONTRAST TECHNIQUE: Contiguous axial images were obtained from the base of the skull through the vertex without intravenous contrast. COMPARISON:  None. FINDINGS: Brain: The ventricles are normal in size and configuration. No extra-axial fluid collections are identified. The gray-white differentiation  is maintained. No CT findings for acute hemispheric infarction or intracranial hemorrhage. No mass lesions. The brainstem and cerebellum are normal. Vascular: No hyperdense vessels or obvious aneurysm. Skull: No acute skull fracture. No bone lesion. Sinuses/Orbits: The paranasal sinuses and mastoid air cells are clear. The globes are intact. Other: No scalp lesions, laceration or hematoma. IMPRESSION: Normal head CT. Electronically Signed   By: Rudie Meyer M.D.   On: 02/16/2020 12:39   CT ABDOMEN PELVIS W CONTRAST  Result Date: 02/19/2020 CLINICAL DATA:  74 year old male with increasing abdominal pain.  EXAM: CT ABDOMEN AND PELVIS WITH CONTRAST TECHNIQUE: Multidetector CT imaging of the abdomen and pelvis was performed using the standard protocol following bolus administration of intravenous contrast. CONTRAST:  OMNIPAQUE IOHEXOL 300 MG/ML  SOLN COMPARISON:  Chest radiographs 02/17/2020. FINDINGS: Lower chest: Mild lung base bronchiectasis. Mild associated lower lobe posterior basal segment scarring. No airspace disease. No pericardial or pleural effusion. Hepatobiliary: Negative liver and gallbladder. Pancreas: Negative. Spleen: Negative. Adrenals/Urinary Tract: Normal adrenal glands. Bilateral renal enhancement and contrast excretion is symmetric and within normal limits. Decompressed proximal ureters. No nephrolithiasis or discrete renal lesion. The urinary bladder is relatively decompressed but contains a moderate amount of gas (series 3, image 65). Mild circumferential bladder wall thickening. No perivesical stranding. Stomach/Bowel: Retained gas and stool in the rectum. Decompressed sigmoid colon. Similar gas and stool throughout the descending and redundant transverse colon. Oral contrast has reached the cecum and proximal ascending colon, mixed with stool there. Negative terminal ileum. And evidence of a normal gas containing appendix on series 3, image 51. There is an 11 cm right inguinal hernia containing multiple nondilated and contrast containing small bowel loops (coronal image 45. No dilated loops upstream or downstream of the hernia. No definite fluid or inflammatory stranding within the hernia sac. Largely decompressed stomach and duodenum. No free air. No free fluid identified. Vascular/Lymphatic: Major arterial structures are patent. Calcified iliac artery atherosclerosis. Portal venous system is patent. No lymphadenopathy. Reproductive: Bowel containing right inguinal hernia as detailed above. Otherwise negative. Other: No definite pelvic free fluid. Musculoskeletal: Advanced degenerative  changes in the lumbar spine including grade 1 spondylolisthesis measuring up to 10 mm at L4-L5. Associated chronic L4 pars fractures. Heterogeneous bone mineralization in the lower spine and pelvis, but no destructive osseous lesion identified. IMPRESSION: 1. Positive for 11 cm right inguinal hernia containing multiple small bowel loops, but no evidence of bowel obstruction or inflammation at this time. 2. Gas within the urinary bladder, suspicious for UTI unless explained by recent catheterization. Mild circumferential bladder wall thickening also noted. 3. No other acute or inflammatory process identified in the abdomen or pelvis. Redundant large bowel with retained stool. Lung base Bronchiectasis and mild pulmonary scarring. Advanced lumbar spine degeneration with chronic L4 pars fractures and grade 1 spondylolisthesis at L4-L5. Electronically Signed   By: Odessa Fleming M.D.   On: 02/19/2020 16:53   DG Chest Port 1 View  Result Date: 02/17/2020 CLINICAL DATA:  Respiratory failure. EXAM: PORTABLE CHEST 1 VIEW COMPARISON:  02/16/2020 FINDINGS: The endotracheal tube is in good position, unchanged. New NG tube is coursing down the esophagus and into the stomach. The tip is in the fundal region and the proximal port is near the GE junction. This should be advanced several cm. The heart is normal in size. Stable tortuous thoracic aorta. Stable emphysematous changes with hyperinflation but no infiltrates or effusions. No pneumothorax. IMPRESSION: 1. New NG tube tip in the fundus. This should be  advanced several cm. 2. Stable endotracheal tube. 3. Stable emphysematous changes and hyperinflation but no acute pulmonary findings. Electronically Signed   By: Rudie Meyer M.D.   On: 02/17/2020 07:04   DG Chest Portable 1 View  Result Date: 02/16/2020 CLINICAL DATA:  Endotracheal tube placement EXAM: PORTABLE CHEST 1 VIEW COMPARISON:  None. FINDINGS: Endotracheal tube is positioned with tip over the mid trachea,  approximately 4.5 cm above the carina. No acute airspace opacity. The heart and mediastinum are unremarkable. IMPRESSION: 1. Endotracheal tube is positioned with tip over the mid trachea, approximately 4.5 cm above the carina. 2. No acute abnormality of the lungs. Electronically Signed   By: Lauralyn Primes M.D.   On: 02/16/2020 11:05     Time Spent in minutes  30     Laverna Peace M.D on 02/20/2020 at 3:16 PM  To page go to [email or web address] - password [facility]

## 2020-02-20 NOTE — Progress Notes (Signed)
Physical Therapy Treatment Patient Details Name: Victor Fitzgerald MRN: [id number] DOB: 09-03-1945 Today's Date: 02/20/2020    History of Present Illness Patient is a 74 year old male with unknown PMH admitted with acute on chronic hypercapnic respiratory failure requiring intubation in ED 9/13-9/14. UA showed large leukocytes with many bacteria urine culture consistent with E. coli.    PT Comments    Patient progressing slowly towards PT goals. Reports breathing is improved today. Improved ambulation distance with Min guard assist and use of RW for support. 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 Sp02 remained >94% on RA during activity. Perfomed 5xSTS in 28.6 seconds (age norm is 12.6 sec) indicating decreased functional strength, impaired balance and fall risk. Encouraged pt to continue to walk to bathroom and only wear 02 if needed. Will follow.    Follow Up Recommendations  SNF;Supervision for mobility/OOB     Equipment Recommendations  Other (comment) (rollator)    Recommendations for Other Services       Precautions / Restrictions Precautions Precautions: Fall Precaution Comments: watch 02 Restrictions Weight Bearing Restrictions: No    Mobility  Bed Mobility               General bed mobility comments: Sitting EOB upon PT arrival.  Transfers Overall transfer level: Needs assistance Equipment used: Rolling walker (2 wheeled) Transfers: Sit to/from Stand Sit to Stand: Min guard         General transfer comment: Min guard for safety. Stood from EOB x6 pulling up on RW, increased time to come to standing. Reports right knee arthritis.  Ambulation/Gait Ambulation/Gait assistance: Min guard Gait Distance (Feet): 30 Feet Assistive device: Rolling walker (2 wheeled) Gait Pattern/deviations: Step-to pattern;Decreased step length - left;Decreased weight shift to right;Antalgic;Step-through  pattern;Decreased stance time - right Gait velocity: Decreased Gait velocity interpretation: <1.31 ft/sec, indicative of household ambulator General Gait Details: Slow, antalgic gait with decreased stance time RLE and right knee instability. Sp02 >94% on RA, 1/4 DOE.   Stairs             Wheelchair Mobility    Modified Rankin (Stroke Patients Only)       Balance Overall balance assessment: Needs assistance Sitting-balance support: Feet supported;No upper extremity supported Sitting balance-Leahy Scale: Good     Standing balance support: During functional activity Standing balance-Leahy Scale: Poor Standing balance comment: Can statically standing without UE support but does better with UE support for dynamic tasks.                            Cognition Arousal/Alertness: Awake/alert Behavior During Therapy: WFL for tasks assessed/performed Overall Cognitive Status: Within Functional Limits for tasks assessed                                 General Comments: for basic mobility tasks.      Exercises      General Comments General comments (skin integrity, edema, etc.): SP02 remained >94% on RA during activity. Perfomed 5xSTS in 28.6 seconds (age norm is 12.6 sec)      Pertinent Vitals/Pain Pain Assessment: Faces Faces Pain Scale: Hurts little more Pain Location: ankles, R knee (arthritic pain) Pain Descriptors / Indicators: Aching Pain Intervention(s): Monitored during session;Repositioned;Limited activity within patient's tolerance    Home Living  Prior Function            PT Goals (current goals can now be found in the care plan section) Progress towards PT goals: Progressing toward goals    Frequency    Min 2X/week      PT Plan Current plan remains appropriate    Co-evaluation              AM-PAC PT "6 Clicks" Mobility   Outcome Measure  Help needed turning from your back to your  side while in a flat bed without using bedrails?: None Help needed moving from lying on your back to sitting on the side of a flat bed without using bedrails?: None Help needed moving to and from a bed to a chair (including a wheelchair)?: A Little Help needed standing up from a chair using your arms (e.g., wheelchair or bedside chair)?: A Little Help needed to walk in hospital room?: A Little Help needed climbing 3-5 steps with a railing? : A Lot 6 Click Score: 19    End of Session Equipment Utilized During Treatment: Gait belt Activity Tolerance: Patient tolerated treatment well;Patient limited by fatigue Patient left: in bed;with call bell/phone within reach (sitting EOB) Nurse Communication: Mobility status PT Visit Diagnosis: Difficulty in walking, not elsewhere classified (R26.2);Other abnormalities of gait and mobility (R26.89)     Time: 5643-3295 PT Time Calculation (min) (ACUTE ONLY): 20 min  Charges:  $Therapeutic Activity: 8-22 mins                     Vale Haven, PT, DPT Acute Rehabilitation Services Pager [phone] Office [phone]       [name] [name] 02/20/2020, 3:16 PM

## 2020-02-20 NOTE — Discharge Instructions (Signed)
° °Inguinal Hernia, Adult °An inguinal hernia is when fat or your intestines push through a weak spot in a muscle where your leg meets your lower belly (groin). This causes a rounded lump (bulge). This kind of hernia could also be: °· In your scrotum, if you are male. °· In folds of skin around your vagina, if you are male. °There are three types of inguinal hernias. These include: °· Hernias that can be pushed back into the belly (are reducible). This type rarely causes pain. °· Hernias that cannot be pushed back into the belly (are incarcerated). °· Hernias that cannot be pushed back into the belly and lose their blood supply (are strangulated). This type needs emergency surgery. °If you do not have symptoms, you may not need treatment. If you have symptoms or a large hernia, you may need surgery. °Follow these instructions at home: °Lifestyle °· Do these things if told by your doctor so you do not have trouble pooping (constipation): °? Drink enough fluid to keep your pee (urine) pale yellow. °? Eat foods that have a lot of fiber. These include fresh fruits and vegetables, whole grains, and beans. °? Limit foods that are high in fat and processed sugars. These include foods that are fried or sweet. °? Take medicine for trouble pooping. °· Avoid lifting heavy objects. °· Avoid standing for long amounts of time. °· Do not use any products that contain nicotine or tobacco. These include cigarettes and e-cigarettes. If you need help quitting, ask your doctor. °· Stay at a healthy weight. °General instructions °· You may try to push your hernia in by very gently pressing on it when you are lying down. Do not try to force the bulge back in if it will not push in easily. °· Watch your hernia for any changes in shape, size, or color. Tell your doctor if you see any changes. °· Take over-the-counter and prescription medicines only as told by your doctor. °· Keep all follow-up visits as told by your doctor. This is  important. °Contact a doctor if: °· You have a fever. °· You have new symptoms. °· Your symptoms get worse. °Get help right away if: °· The area where your leg meets your lower belly has: °? Pain that gets worse suddenly. °? A bulge that gets bigger suddenly, and it does not get smaller after that. °? A bulge that turns red or purple. °? A bulge that is painful when you touch it. °· You are a man, and your scrotum: °? Suddenly feels painful. °? Suddenly changes in size. °· You cannot push the hernia in by very gently pressing on it when you are lying down. Do not try to force the bulge back in if it will not push in easily. °· You feel sick to your stomach (nauseous), and that feeling does not go away. °· You throw up (vomit), and that keeps happening. °· You have a fast heartbeat. °· You cannot poop (have a bowel movement) or pass gas. °These symptoms may be an emergency. Do not wait to see if the symptoms will go away. Get medical help right away. Call your local emergency services (911 in the [place] °Summary °· An inguinal hernia is when fat or your intestines push through a weak spot in a muscle where your leg meets your lower belly (groin). This causes a rounded lump (bulge). °· If you do not have symptoms, you may not need treatment. If you have symptoms or a large hernia,   you may need surgery. °· Avoid lifting heavy objects. Also avoid standing for long amounts of time. °· Do not try to force the bulge back in if it will not push in easily. °This information is not intended to replace advice given to you by your health care provider. Make sure you discuss any questions you have with your health care provider. °Document Revised: 06/23/2017 Document Reviewed: 02/21/2017 °Elsevier Patient Education © 2020 [employer] Inc[employer] ° °

## 2020-02-21 LAB — CBC WITH DIFFERENTIAL/PLATELET
Abs Immature Granulocytes: 0.01 10*3/uL (ref 0.00–0.07)
Basophils Absolute: 0 10*3/uL (ref 0.0–0.1)
Basophils Relative: 0 %
Eosinophils Absolute: 0.3 10*3/uL (ref 0.0–0.5)
Eosinophils Relative: 4 %
HCT: 35.4 % — ABNORMAL LOW (ref 39.0–52.0)
Hemoglobin: 11.1 g/dL — ABNORMAL LOW (ref 13.0–17.0)
Immature Granulocytes: 0 %
Lymphocytes Relative: 35 %
Lymphs Abs: 2.3 10*3/uL (ref 0.7–4.0)
MCH: 25 pg — ABNORMAL LOW (ref 26.0–34.0)
MCHC: 31.4 g/dL (ref 30.0–36.0)
MCV: 79.7 fL — ABNORMAL LOW (ref 80.0–100.0)
Monocytes Absolute: 0.6 10*3/uL (ref 0.1–1.0)
Monocytes Relative: 9 %
Neutro Abs: 3.3 10*3/uL (ref 1.7–7.7)
Neutrophils Relative %: 52 %
Platelets: 89 10*3/uL — ABNORMAL LOW (ref 150–400)
RBC: 4.44 MIL/uL (ref 4.22–5.81)
RDW: 16.8 % — ABNORMAL HIGH (ref 11.5–15.5)
WBC: 6.6 10*3/uL (ref 4.0–10.5)
nRBC: 0 % (ref 0.0–0.2)

## 2020-02-21 LAB — CULTURE, BLOOD (ROUTINE X 2)
Culture: NO GROWTH
Culture: NO GROWTH

## 2020-02-21 LAB — TRIGLYCERIDES: Triglycerides: 49 mg/dL (ref <150)

## 2020-02-21 NOTE — Progress Notes (Signed)
[facility]  PROGRESS NOTE  Victor Fitzgerald ZOX:[id number] DOB: 04-02-1946 DOA: 02/16/2020 PCP: Patient, No Pcp Per Admit date - 02/16/2020   Admitting Physician No admitting provider for patient encounter.  Outpatient Primary MD for the patient is Patient, No Pcp Per  LOS - 5 Brief Narrative   Victor Fitzgerald is a 74 y.o. year old male with medical history significant for reported asthma who presented on 02/16/2020 with progressive shortness of breath and respiratory distress with O2 sats in the 60s and tachypnea with respiratory rate greater than 40 and conversational dyspnea failed trial of BiPAP due to decreasing consciousness required intubation.   ABG was noted to have mild CO2 of 54 and pH of 7.3 and adequate oxygenation.  UA showed large leukocytes with many bacteria urine culture consistent with E. coli.  Covid test was negative.  CTA was also unremarkable.  Patient was intubated on 9/13   Subjective  Groin pain much improved new scrotal support.  Reports stable breathing status. A & P   Acute hypercarbic respiratory failure, resolved.  Presumed secondary to COPD exacerbation given bronchospasms and mild hypercarbia on admission and emphysematous changes on chest x-ray.  Patient was able to be extubated and weaned to nasal cannula with continuation of scheduled DuoNebs and prednisone burst on 9/14.  Patient reports history of asthma for which he only took over-the-counter inhalers.  Currently doing much better and off supplemental O2 only occasional wheezing with no conversational dyspnea.   -PRN duonebs, may need controller inhaler -doxycycline for inflammatory involvement -Incentive spirometer, flutter valve, out of bed to chair -Prednisone burst 10 mg x 5 days  Large right inguinal hernia,.  No signs of strangulation or incarceration on exam or CT.  Continues to be reducible.  Discussed case with surgery who recommends supportive care (hernia truss), given large size at lower  risk for incarceration -Referral placed with Dr. Derrell Lolling to discuss elective hernia repair as outpatient -continue to monitor, scrotal support while inpatient -Advised use of hernia truss/belt on discharge  E. coli UTI.  Pansensitive -Continue cefdinir for total 5 days, end date 9/19  Pancytopenia seems new,  stable. Last documented CBC from 2015 wnl. Hgb 11.6 and normocytic, platelets 54, and WBC 3.9. no localizing signs or symptoms of bleeding or bruising. HIV, INR, LFTs wnl -check peripheral smear,HCV/hepatitis panel, - daily CBC  Lactic acidosis, resolved Lactic acid of 2.7 on admission.  No bacteremia  Elevated troponin, resolved.  On admission troponin 41 likely demand ischemia in the setting of respiratory distress as mentioned above  Acute metabolic encephalopathy, resolved.  In the setting of hypercarbia/infectious etiology    Family Communication  : None  Code Status : Full code  Disposition Plan  :  Patient is from home. Anticipated d/c date:  Medically stable for discharge.  SNF to recommend continued rehabilitation for physical dependence per PT evaluation recommendations barriers to d/c or necessity for inpatient status:  medically stable for discharge, anticipate discharge to SNF once bed available next 24 to 48 hours Consults  : PCCM  Procedures  : Intubated 9/13, extubated 9/14  DVT Prophylaxis  : Heparin  Lab Results  Component Value Date   PLT 89 (L) 02/21/2020    Diet :  Diet Order            Diet regular Room service appropriate? Yes; Fluid consistency: Thin  Diet effective now                  Inpatient Medications  Scheduled Meds:  cefdinir  300 mg Oral Q12H   Chlorhexidine Gluconate Cloth  6 each Topical Daily   docusate sodium  100 mg Oral BID   doxycycline  100 mg Oral Q12H   guaiFENesin  1,200 mg Oral BID   heparin  5,000 Units Subcutaneous Q8H   mouth rinse  15 mL Mouth Rinse BID   polyethylene glycol  17 g Oral Daily    Continuous Infusions:  PRN Meds:.docusate sodium, ipratropium-albuterol, midazolam, polyethylene glycol  Antibiotics  :   Anti-infectives (From admission, onward)   Start     Dose/Rate Route Frequency Ordered Stop   02/17/20 1300  doxycycline (VIBRA-TABS) tablet 100 mg        100 mg Oral Every 12 hours 02/17/20 1212 02/23/20 0959   02/17/20 1245  cefdinir (OMNICEF) capsule 300 mg        300 mg Oral Every 12 hours 02/17/20 1149 02/22/20 0959   02/17/20 1245  azithromycin (ZITHROMAX) tablet 250 mg  Status:  Discontinued        250 mg Oral Daily 02/17/20 1149 02/17/20 1212   02/16/20 1245  cefTRIAXone (ROCEPHIN) 2 g in sodium chloride 0.9 % 100 mL IVPB  Status:  Discontinued        2 g 200 mL/hr over 30 Minutes Intravenous Every 24 hours 02/16/20 1231 02/17/20 1149       Objective   Vitals:   02/20/20 2341 02/21/20 0350 02/21/20 0744 02/21/20 1145  BP: (!) 122/94 140/83 119/73 135/84  Pulse: 74 71 68 66  Resp: 16 14 14 16   Temp: 98.6 F (37 C) 97.8 F (36.6 C) 97.9 F (36.6 C) 98.6 F (37 C)  TempSrc: Oral Oral Oral Oral  SpO2: 100% 100% 100% 100%  Weight:        SpO2: 100 % O2 Flow Rate (L/min): 2 L/min FiO2 (%): 40 %  Wt Readings from Last 3 Encounters:  02/18/20 78.1 kg     Intake/Output Summary (Last 24 hours) at 02/21/2020 1253 Last data filed at 02/21/2020 0900 Gross per 24 hour  Intake 480 ml  Output 1300 ml  Net -820 ml    Physical Exam:     Awake Alert, Oriented X 3, Normal affect No new F.N deficits,  Victor Fitzgerald, Normal respiratory effort on room air, occasional wheezing no conversational dyspnea RRR,No Gallops,Rubs or new Murmurs,  +ve B.Sounds, Abd Soft, No tenderness, No rebound, guarding or rigidity. Right inguinal hernia present and reducible scrotal support in place, No Cyanosis, No new Rash or bruise     I have personally reviewed the following:   Data Reviewed:  CBC Recent Labs  Lab 02/16/20 1057 02/16/20 1138 02/17/20 0143  02/17/20 0201 02/21/20 0225  WBC 3.3*  --  3.9*  --  6.6  HGB 12.5* 13.6 11.1* 11.6* 11.1*  HCT 43.1 40.0 36.3* 34.0* 35.4*  PLT 62*  --  54*  --  89*  MCV 86.0  --  82.1  --  79.7*  MCH 25.0*  --  25.1*  --  25.0*  MCHC 29.0*  --  30.6  --  31.4  RDW 17.3*  --  17.2*  --  16.8*  LYMPHSABS 0.8  --   --   --  2.3  MONOABS 0.2  --   --   --  0.6  EOSABS 0.1  --   --   --  0.3  BASOSABS 0.0  --   --   --  0.0  Chemistries  Recent Labs  Lab 02/16/20 1057 02/16/20 1138 02/16/20 1835 02/17/20 0143 02/17/20 0201  NA 143 145 142 143 145  K 3.6 2.9* 3.7 3.9 3.7  CL 105  --  105 108  --   CO2 26  --  27 26  --   GLUCOSE 194*  --  130* 127*  --   BUN 16  --  18 19  --   CREATININE 0.91  --  0.87 0.75  --   CALCIUM 8.9  --  8.7* 8.8*  --   MG  --   --   --  1.8  --   AST 26  --   --   --   --   ALT 16  --   --   --   --   ALKPHOS 103  --   --   --   --   BILITOT 0.5  --   --   --   --    ------------------------------------------------------------------------------------------------------------------ Recent Labs    02/20/20 0133 02/21/20 0225  TRIG 67 49    No results found for: HGBA1C ------------------------------------------------------------------------------------------------------------------ No results for input(s): TSH, T4TOTAL, T3FREE, THYROIDAB in the last 72 hours.  Invalid input(s): FREET3 ------------------------------------------------------------------------------------------------------------------ No results for input(s): VITAMINB12, FOLATE, FERRITIN, TIBC, IRON, RETICCTPCT in the last 72 hours.  Coagulation profile Recent Labs  Lab 02/20/20 1829  INR 1.1    No results for input(s): DDIMER in the last 72 hours.  Cardiac Enzymes No results for input(s): CKMB, TROPONINI, MYOGLOBIN in the last 168 hours.  Invalid input(s): CK ------------------------------------------------------------------------------------------------------------------ No  results found for: BNP  Micro Results Recent Results (from the past 240 hour(s))  SARS Coronavirus 2 by RT PCR (hospital order, performed in [facility] hospital lab) Nasopharyngeal Nasopharyngeal Swab     Status: None   Collection Time: 02/16/20 10:39 AM   Specimen: Nasopharyngeal Swab  Result Value Ref Range Status   SARS Coronavirus 2 NEGATIVE NEGATIVE Final    Comment: (NOTE) SARS-CoV-2 target nucleic acids are NOT DETECTED.  The SARS-CoV-2 RNA is generally detectable in upper and lower respiratory specimens during the acute phase of infection. The lowest concentration of SARS-CoV-2 viral copies this assay can detect is 250 copies / mL. A negative result does not preclude SARS-CoV-2 infection and should not be used as the sole basis for treatment or other patient management decisions.  A negative result may occur with improper specimen collection / handling, submission of specimen other than nasopharyngeal swab, presence of viral mutation(s) within the areas targeted by this assay, and inadequate number of viral copies (<250 copies / mL). A negative result must be combined with clinical observations, patient history, and epidemiological information.  Fact Sheet for Patients:   BoilerBrush.com.cy  Fact Sheet for Healthcare Providers: https://pope.com/  This test is not yet approved or  cleared by the [place] FDA and has been authorized for detection and/or diagnosis of SARS-CoV-2 by FDA under an Emergency Use Authorization (EUA).  This EUA will remain in effect (meaning this test can be used) for the duration of the COVID-19 declaration under Section 564(b)(1) of the Act, 21 U.S.C. section 360bbb-3(b)(1), unless the authorization is terminated or revoked sooner.  Performed at [facility], [address] [place], [place] [address]   Urine culture     Status: Abnormal   Collection Time: 02/16/20 12:42 PM    Specimen: Urine, Random  Result Value Ref Range Status   Specimen Description URINE, RANDOM  Final   Special Requests   Final    NONE Performed at [facility], [address], [place], [place] [address]    Culture >=100,000 COLONIES/mL ESCHERICHIA COLI (A)  Final   Report Status 02/18/2020 FINAL  Final   Organism ID, Bacteria ESCHERICHIA COLI (A)  Final      Susceptibility   Escherichia coli - MIC*    AMPICILLIN 4 SENSITIVE Sensitive     CEFAZOLIN <=4 SENSITIVE Sensitive     CEFTRIAXONE <=0.25 SENSITIVE Sensitive     CIPROFLOXACIN <=0.25 SENSITIVE Sensitive     GENTAMICIN <=1 SENSITIVE Sensitive     IMIPENEM <=0.25 SENSITIVE Sensitive     NITROFURANTOIN <=16 SENSITIVE Sensitive     TRIMETH/SULFA <=20 SENSITIVE Sensitive     AMPICILLIN/SULBACTAM <=2 SENSITIVE Sensitive     PIP/TAZO <=4 SENSITIVE Sensitive     * >=100,000 COLONIES/mL ESCHERICHIA COLI  Culture, blood (Routine X 2) w Reflex to ID Panel     Status: None (Preliminary result)   Collection Time: 02/16/20  1:30 PM   Specimen: BLOOD RIGHT HAND  Result Value Ref Range Status   Specimen Description BLOOD RIGHT HAND  Final   Special Requests   Final    BOTTLES DRAWN AEROBIC ONLY Blood Culture results may not be optimal due to an inadequate volume of blood received in culture bottles   Culture   Final    NO GROWTH 4 DAYS Performed at [facility], [address], [place], [place] [address]    Report Status PENDING  Incomplete  Culture, blood (Routine X 2) w Reflex to ID Panel     Status: None (Preliminary result)   Collection Time: 02/16/20  1:41 PM   Specimen: BLOOD RIGHT HAND  Result Value Ref Range Status   Specimen Description BLOOD RIGHT HAND  Final   Special Requests   Final    BOTTLES DRAWN AEROBIC AND ANAEROBIC Blood Culture results may not be optimal due to an inadequate volume of blood received in culture bottles   Culture   Final    NO GROWTH 4 DAYS Performed at [facility], [address] [place], [place] [address]    Report Status PENDING  Incomplete  MRSA PCR Screening     Status: Abnormal   Collection Time: 02/16/20  9:40 PM   Specimen: Nasopharyngeal  Result Value Ref Range Status   MRSA by PCR POSITIVE (A) NEGATIVE Final    Comment:        The GeneXpert MRSA Assay (FDA approved for NASAL specimens only), is one component of a comprehensive MRSA colonization surveillance program. It is not intended to diagnose MRSA infection nor to guide or monitor treatment for MRSA infections. RESULT CALLED TO, READ BACK BY AND VERIFIED WITH: R PROCTER RN 02/16/20 AT 2342 SK Performed at [facility], [address], [place], [place] [address]     Radiology Reports CT Head Wo Contrast  Result Date: 02/16/2020 CLINICAL DATA:  Respiratory distress. EXAM: CT HEAD WITHOUT CONTRAST TECHNIQUE: Contiguous axial images were obtained from the base of the skull through the vertex without intravenous contrast. COMPARISON:  None. FINDINGS: Brain: The ventricles are normal in size and configuration. No extra-axial fluid collections are identified. The gray-white differentiation is maintained. No CT findings for acute hemispheric infarction or intracranial hemorrhage. No mass lesions. The brainstem and cerebellum are normal. Vascular: No hyperdense vessels or obvious aneurysm. Skull: No acute skull fracture. No bone lesion. Sinuses/Orbits: The  paranasal sinuses and mastoid air cells are clear. The globes are intact. Other: No scalp lesions, laceration or hematoma. IMPRESSION: Normal head CT. Electronically Signed   By: Rudie Meyer M.D.   On: 02/16/2020 12:39   CT ABDOMEN PELVIS W CONTRAST  Result Date: 02/19/2020 CLINICAL DATA:  74 year old male with increasing abdominal pain. EXAM: CT ABDOMEN AND PELVIS WITH CONTRAST TECHNIQUE: Multidetector CT imaging of the abdomen and pelvis was performed using the standard protocol following bolus administration of intravenous contrast.  CONTRAST:  OMNIPAQUE IOHEXOL 300 MG/ML  SOLN COMPARISON:  Chest radiographs 02/17/2020. FINDINGS: Lower chest: Mild lung base bronchiectasis. Mild associated lower lobe posterior basal segment scarring. No airspace disease. No pericardial or pleural effusion. Hepatobiliary: Negative liver and gallbladder. Pancreas: Negative. Spleen: Negative. Adrenals/Urinary Tract: Normal adrenal glands. Bilateral renal enhancement and contrast excretion is symmetric and within normal limits. Decompressed proximal ureters. No nephrolithiasis or discrete renal lesion. The urinary bladder is relatively decompressed but contains a moderate amount of gas (series 3, image 65). Mild circumferential bladder wall thickening. No perivesical stranding. Stomach/Bowel: Retained gas and stool in the rectum. Decompressed sigmoid colon. Similar gas and stool throughout the descending and redundant transverse colon. Oral contrast has reached the cecum and proximal ascending colon, mixed with stool there. Negative terminal ileum. And evidence of a normal gas containing appendix on series 3, image 51. There is an 11 cm right inguinal hernia containing multiple nondilated and contrast containing small bowel loops (coronal image 45. No dilated loops upstream or downstream of the hernia. No definite fluid or inflammatory stranding within the hernia sac. Largely decompressed stomach and duodenum. No free air. No free fluid identified. Vascular/Lymphatic: Major arterial structures are patent. Calcified iliac artery atherosclerosis. Portal venous system is patent. No lymphadenopathy. Reproductive: Bowel containing right inguinal hernia as detailed above. Otherwise negative. Other: No definite pelvic free fluid. Musculoskeletal: Advanced degenerative changes in the lumbar spine including grade 1 spondylolisthesis measuring up to 10 mm at L4-L5. Associated chronic L4 pars fractures. Heterogeneous bone mineralization in the lower spine and pelvis, but  no destructive osseous lesion identified. IMPRESSION: 1. Positive for 11 cm right inguinal hernia containing multiple small bowel loops, but no evidence of bowel obstruction or inflammation at this time. 2. Gas within the urinary bladder, suspicious for UTI unless explained by recent catheterization. Mild circumferential bladder wall thickening also noted. 3. No other acute or inflammatory process identified in the abdomen or pelvis. Redundant large bowel with retained stool. Lung base Bronchiectasis and mild pulmonary scarring. Advanced lumbar spine degeneration with chronic L4 pars fractures and grade 1 spondylolisthesis at L4-L5. Electronically Signed   By: Odessa Fleming M.D.   On: 02/19/2020 16:53   DG Chest Port 1 View  Result Date: 02/17/2020 CLINICAL DATA:  Respiratory failure. EXAM: PORTABLE CHEST 1 VIEW COMPARISON:  02/16/2020 FINDINGS: The endotracheal tube is in good position, unchanged. New NG tube is coursing down the esophagus and into the stomach. The tip is in the fundal region and the proximal port is near the GE junction. This should be advanced several cm. The heart is normal in size. Stable tortuous thoracic aorta. Stable emphysematous changes with hyperinflation but no infiltrates or effusions. No pneumothorax. IMPRESSION: 1. New NG tube tip in the fundus. This should be advanced several cm. 2. Stable endotracheal tube. 3. Stable emphysematous changes and hyperinflation but no acute pulmonary findings. Electronically Signed   By: Rudie Meyer M.D.   On: 02/17/2020 07:04   DG Chest Portable 1  View  Result Date: 02/16/2020 CLINICAL DATA:  Endotracheal tube placement EXAM: PORTABLE CHEST 1 VIEW COMPARISON:  None. FINDINGS: Endotracheal tube is positioned with tip over the mid trachea, approximately 4.5 cm above the carina. No acute airspace opacity. The heart and mediastinum are unremarkable. IMPRESSION: 1. Endotracheal tube is positioned with tip over the mid trachea, approximately 4.5 cm above  the carina. 2. No acute abnormality of the lungs. Electronically Signed   By: Lauralyn PrimesAlex  Bibbey M.D.   On: 02/16/2020 11:05     Time Spent in minutes  30     Laverna PeaceShayla D Eduard Penkala M.D on 02/21/2020 at 12:53 PM  To page go to [email or web address] - password [email or web address]

## 2020-02-22 DIAGNOSIS — D61818 Other pancytopenia: Secondary | ICD-10-CM

## 2020-02-22 LAB — CBC WITH DIFFERENTIAL/PLATELET
Abs Immature Granulocytes: 0.02 10*3/uL (ref 0.00–0.07)
Basophils Absolute: 0 10*3/uL (ref 0.0–0.1)
Basophils Relative: 0 %
Eosinophils Absolute: 0.4 10*3/uL (ref 0.0–0.5)
Eosinophils Relative: 8 %
HCT: 35.1 % — ABNORMAL LOW (ref 39.0–52.0)
Hemoglobin: 10.9 g/dL — ABNORMAL LOW (ref 13.0–17.0)
Immature Granulocytes: 0 %
Lymphocytes Relative: 40 %
Lymphs Abs: 2.2 10*3/uL (ref 0.7–4.0)
MCH: 24.9 pg — ABNORMAL LOW (ref 26.0–34.0)
MCHC: 31.1 g/dL (ref 30.0–36.0)
MCV: 80.1 fL (ref 80.0–100.0)
Monocytes Absolute: 0.4 10*3/uL (ref 0.1–1.0)
Monocytes Relative: 7 %
Neutro Abs: 2.5 10*3/uL (ref 1.7–7.7)
Neutrophils Relative %: 45 %
Platelets: 97 10*3/uL — ABNORMAL LOW (ref 150–400)
RBC: 4.38 MIL/uL (ref 4.22–5.81)
RDW: 16.7 % — ABNORMAL HIGH (ref 11.5–15.5)
WBC: 5.5 10*3/uL (ref 4.0–10.5)
nRBC: 0 % (ref 0.0–0.2)

## 2020-02-22 LAB — IRON AND TIBC
Iron: 67 ug/dL (ref 45–182)
Saturation Ratios: 32 % (ref 17.9–39.5)
TIBC: 210 ug/dL — ABNORMAL LOW (ref 250–450)
UIBC: 143 ug/dL

## 2020-02-22 LAB — SARS CORONAVIRUS 2 (TAT 6-24 HRS): SARS Coronavirus 2: NEGATIVE

## 2020-02-22 LAB — TRIGLYCERIDES: Triglycerides: 88 mg/dL (ref <150)

## 2020-02-22 LAB — HEPATITIS PANEL, ACUTE
HCV Ab: NONREACTIVE
Hep A IgM: NONREACTIVE
Hep B C IgM: REACTIVE — AB
Hepatitis B Surface Ag: NONREACTIVE

## 2020-02-22 LAB — FERRITIN: Ferritin: 108 ng/mL (ref 24–336)

## 2020-02-22 MED ORDER — TIOTROPIUM BROMIDE MONOHYDRATE 18 MCG IN CAPS: 18.0000 ug | ORAL_CAPSULE | Freq: Every day | RESPIRATORY_TRACT | Status: DC

## 2020-02-22 MED ORDER — UMECLIDINIUM BROMIDE 62.5 MCG/INH IN AEPB
1.0000 | INHALATION_SPRAY | Freq: Every day | RESPIRATORY_TRACT | Status: DC
  Filled 2020-02-22: qty 7

## 2020-02-22 MED ORDER — UMECLIDINIUM BROMIDE 62.5 MCG/INH IN AEPB
1.0000 | INHALATION_SPRAY | Freq: Every day | RESPIRATORY_TRACT | Status: DC
  Administered 2020-02-23 – 2020-02-24 (×2): 1 via RESPIRATORY_TRACT
  Filled 2020-02-22: qty 7

## 2020-02-22 NOTE — TOC Progression Note (Addendum)
Transition of Care [facility]) - Progression Note    Patient Details  Name: Victor Fitzgerald MRN: [id number] Date of Birth: [birth year]  Transition of Care [facility] CM/SW Contact  Cleda Clarks, [place] Phone Number: 02/22/2020, 9:10 AM  Clinical Narrative:    CSW contacted [facility] to inquire on bed availability. Left a voicemail, awaiting a callback. Requested covid from RN.   Expected Discharge Plan: Homeless Shelter Barriers to Discharge: Continued Medical Work up  Expected Discharge Plan and Services Expected Discharge Plan: Homeless Shelter   Discharge Planning Services: CM Consult   Living arrangements for the past 2 months: Hotel/Motel (But has check out and states has no where to go)                                       Social Determinants of Health (SDOH) Interventions    Readmission Risk Interventions No flowsheet data found.

## 2020-02-22 NOTE — Progress Notes (Signed)
[facility]  PROGRESS NOTE  Victor Fitzgerald ZOX:[id number] DOB: 1945/08/30 DOA: 02/16/2020 PCP: Patient, No Pcp Per Admit date - 02/16/2020   Admitting Physician No admitting provider for patient encounter.  Outpatient Primary MD for the patient is Patient, No Pcp Per  LOS - 6 Brief Narrative   Victor Fitzgerald is a 74 y.o. year old male with medical history significant for reported asthma who presented on 02/16/2020 with progressive shortness of breath and respiratory distress with O2 sats in the 60s and tachypnea with respiratory rate greater than 40 and conversational dyspnea failed trial of BiPAP due to decreasing consciousness required intubation.   ABG was noted to have mild CO2 of 54 and pH of 7.3 and adequate oxygenation.  UA showed large leukocytes with many bacteria urine culture consistent with E. coli.  Covid test was negative.  CTA was also unremarkable.  Patient was intubated on 9/13   Subjective  Groin pain much improved new scrotal support.  Reports stable breathing status. A & P   Acute hypercarbic respiratory failure, resolved.  Presumed secondary to COPD exacerbation given bronchospasms and mild hypercarbia on admission and emphysematous changes on chest x-ray.  Patient was able to be extubated and weaned to nasal cannula with continuation of scheduled DuoNebs and prednisone burst on 9/14.  Patient reports history of asthma for which he only took over-the-counter inhalers.  Currently doing much better and off supplemental O2.  Still feels somewhat symptomatic when he walks but not requiring any O2 and wheezing still very notable, but without conversational dyspnea.  Patient is minimally symptomatic but does have high risk for exacerbation I think we want him starting long-acting inhaler therapy, I will go up from [facility] given decreased risk for further hospitalizations  -We will start Spiriva prior to discharge -PRN duonebs, -doxycycline for inflammatory involvement ( 7 days,  end date9/20) -Incentive spirometer, flutter valve, out of bed to chair -Prednisone burst 10 mg x 5 days  Large right inguinal hernia,.  No signs of strangulation or incarceration on exam or CT.  Continues to be reducible.  Discussed case with surgery who recommends supportive care (hernia truss), given large size at lower risk for incarceration -Referral placed with Dr. Derrell Lolling to discuss elective hernia repair as outpatient -continue to monitor, scrotal support while inpatient -Advised use of hernia truss/belt on discharge  E. coli UTI.  Pansensitive -completed cefdinir for total 5 days, end date 9/19  Pancytopenia seems new,resolved Normocytic anemia Thrombocytopenia . Last documented CBC from 2015 wnl. Hgb 11.6 and normocytic, platelets are now 97, leukopenia resolved, hgb stable. no localizing signs or symptoms of bleeding or bruising. HIV, INR, LFTs are wnl  -check peripheral smear,HCV/hepatitis panel, --check iron level, MCV was microcytic earlier in hospital stay suspect iron deficiency - daily CBC  Lactic acidosis, resolved Lactic acid of 2.7 on admission.  No bacteremia  Elevated troponin, resolved.  On admission troponin 41 likely demand ischemia in the setting of respiratory distress as mentioned above  Acute metabolic encephalopathy, resolved.  In the setting of hypercarbia/infectious etiology    Family Communication  : None  Code Status : Full code  Disposition Plan  :  Patient is from home. Anticipated d/c date:  Medically stable for discharge.  SNF to recommend continued rehabilitation for physical dependence per PT evaluation recommendations barriers to d/c or necessity for inpatient status:  medically stable for discharge, anticipate discharge to SNF once bed available next 24 to 48 hours Consults  : PCCM  Procedures  :  Intubated 9/13, extubated 9/14  DVT Prophylaxis  : Heparin  Lab Results  Component Value Date   PLT 97 (L) 02/22/2020    Diet :  Diet  Order            Diet regular Room service appropriate? Yes; Fluid consistency: Thin  Diet effective now                  Inpatient Medications Scheduled Meds: . Chlorhexidine Gluconate Cloth  6 each Topical Daily  . docusate sodium  100 mg Oral BID  . doxycycline  100 mg Oral Q12H  . guaiFENesin  1,200 mg Oral BID  . heparin  5,000 Units Subcutaneous Q8H  . mouth rinse  15 mL Mouth Rinse BID  . polyethylene glycol  17 g Oral Daily   Continuous Infusions:  PRN Meds:.docusate sodium, ipratropium-albuterol, midazolam, polyethylene glycol  Antibiotics  :   Anti-infectives (From admission, onward)   Start     Dose/Rate Route Frequency Ordered Stop   02/17/20 1300  doxycycline (VIBRA-TABS) tablet 100 mg        100 mg Oral Every 12 hours 02/17/20 1212 02/23/20 0959   02/17/20 1245  cefdinir (OMNICEF) capsule 300 mg        300 mg Oral Every 12 hours 02/17/20 1149 02/21/20 2137   02/17/20 1245  azithromycin (ZITHROMAX) tablet 250 mg  Status:  Discontinued        250 mg Oral Daily 02/17/20 1149 02/17/20 1212   02/16/20 1245  cefTRIAXone (ROCEPHIN) 2 g in sodium chloride 0.9 % 100 mL IVPB  Status:  Discontinued        2 g 200 mL/hr over 30 Minutes Intravenous Every 24 hours 02/16/20 1231 02/17/20 1149       Objective   Vitals:   02/21/20 1530 02/21/20 1900 02/22/20 0323 02/22/20 0728  BP: 120/85 126/75 113/79 128/78  Pulse: 85 73 63 62  Resp: 16 16 16 18   Temp: 98.3 F (36.8 C) 98.4 F (36.9 C) 98.2 F (36.8 C) 98.1 F (36.7 C)  TempSrc: Oral Oral Oral Oral  SpO2: 100% 100% 100% 100%  Weight:        SpO2: 100 % O2 Flow Rate (L/min): 2 L/min FiO2 (%): 40 %  Wt Readings from Last 3 Encounters:  02/18/20 78.1 kg     Intake/Output Summary (Last 24 hours) at 02/22/2020 1153 Last data filed at 02/22/2020 0900 Gross per 24 hour  Intake 600 ml  Output 375 ml  Net 225 ml    Physical Exam:     Awake Alert, Oriented X 3, Normal affect No new F.N deficits,    [place].AT, Normal respiratory effort on room air, occasional wheezing no conversational dyspnea RRR,No Gallops,Rubs or new Murmurs,  +ve B.Sounds, Abd Soft, No tenderness, No rebound, guarding or rigidity. Right inguinal hernia present and reducible  No Cyanosis, No new Rash or bruise     I have personally reviewed the following:   Data Reviewed:  CBC Recent Labs  Lab 02/16/20 1057 02/16/20 1057 02/16/20 1138 02/17/20 0143 02/17/20 0201 02/21/20 0225 02/22/20 0741  WBC 3.3*  --   --  3.9*  --  6.6 5.5  HGB 12.5*   < > 13.6 11.1* 11.6* 11.1* 10.9*  HCT 43.1   < > 40.0 36.3* 34.0* 35.4* 35.1*  PLT 62*  --   --  54*  --  89* 97*  MCV 86.0  --   --  82.1  --  79.7* 80.1  MCH 25.0*  --   --  25.1*  --  25.0* 24.9*  MCHC 29.0*  --   --  30.6  --  31.4 31.1  RDW 17.3*  --   --  17.2*  --  16.8* 16.7*  LYMPHSABS 0.8  --   --   --   --  2.3 2.2  MONOABS 0.2  --   --   --   --  0.6 0.4  EOSABS 0.1  --   --   --   --  0.3 0.4  BASOSABS 0.0  --   --   --   --  0.0 0.0   < > = values in this interval not displayed.    Chemistries  Recent Labs  Lab 02/16/20 1057 02/16/20 1138 02/16/20 1835 02/17/20 0143 02/17/20 0201  NA 143 145 142 143 145  K 3.6 2.9* 3.7 3.9 3.7  CL 105  --  105 108  --   CO2 26  --  27 26  --   GLUCOSE 194*  --  130* 127*  --   BUN 16  --  18 19  --   CREATININE 0.91  --  0.87 0.75  --   CALCIUM 8.9  --  8.7* 8.8*  --   MG  --   --   --  1.8  --   AST 26  --   --   --   --   ALT 16  --   --   --   --   ALKPHOS 103  --   --   --   --   BILITOT 0.5  --   --   --   --    ------------------------------------------------------------------------------------------------------------------ Recent Labs    02/21/20 0225 02/22/20 0741  TRIG 49 88    No results found for: HGBA1C ------------------------------------------------------------------------------------------------------------------ No results for input(s): TSH, T4TOTAL, T3FREE, THYROIDAB in the  last 72 hours.  Invalid input(s): FREET3 ------------------------------------------------------------------------------------------------------------------ No results for input(s): VITAMINB12, FOLATE, FERRITIN, TIBC, IRON, RETICCTPCT in the last 72 hours.  Coagulation profile Recent Labs  Lab 02/20/20 1829  INR 1.1    No results for input(s): DDIMER in the last 72 hours.  Cardiac Enzymes No results for input(s): CKMB, TROPONINI, MYOGLOBIN in the last 168 hours.  Invalid input(s): CK ------------------------------------------------------------------------------------------------------------------ No results found for: BNP  Micro Results Recent Results (from the past 240 hour(s))  SARS Coronavirus 2 by RT PCR (hospital order, performed in [facility] hospital lab) Nasopharyngeal Nasopharyngeal Swab     Status: None   Collection Time: 02/16/20 10:39 AM   Specimen: Nasopharyngeal Swab  Result Value Ref Range Status   SARS Coronavirus 2 NEGATIVE NEGATIVE Final    Comment: (NOTE) SARS-CoV-2 target nucleic acids are NOT DETECTED.  The SARS-CoV-2 RNA is generally detectable in upper and lower respiratory specimens during the acute phase of infection. The lowest concentration of SARS-CoV-2 viral copies this assay can detect is 250 copies / mL. A negative result does not preclude SARS-CoV-2 infection and should not be used as the sole basis for treatment or other patient management decisions.  A negative result may occur with improper specimen collection / handling, submission of specimen other than nasopharyngeal swab, presence of viral mutation(s) within the areas targeted by this assay, and inadequate number of viral copies (<250 copies / mL). A negative result must be combined with clinical observations, patient history, and epidemiological information.  Fact Sheet for Patients:  BoilerBrush.com.cy  Fact Sheet for Healthcare  Providers: https://pope.com/  This test is not yet approved or  cleared by the [place] FDA and has been authorized for detection and/or diagnosis of SARS-CoV-2 by FDA under an Emergency Use Authorization (EUA).  This EUA will remain in effect (meaning this test can be used) for the duration of the COVID-19 declaration under Section 564(b)(1) of the Act, 21 U.S.C. section 360bbb-3(b)(1), unless the authorization is terminated or revoked sooner.  Performed at [facility], [address] [place], [place] [address]   Urine culture     Status: Abnormal   Collection Time: 02/16/20 12:42 PM   Specimen: Urine, Random  Result Value Ref Range Status   Specimen Description URINE, RANDOM  Final   Special Requests   Final    NONE Performed at [facility], [address] [place], [place] [address]    Culture >=100,000 COLONIES/mL ESCHERICHIA COLI (A)  Final   Report Status 02/18/2020 FINAL  Final   Organism ID, Bacteria ESCHERICHIA COLI (A)  Final      Susceptibility   Escherichia coli - MIC*    AMPICILLIN 4 SENSITIVE Sensitive     CEFAZOLIN <=4 SENSITIVE Sensitive     CEFTRIAXONE <=0.25 SENSITIVE Sensitive     CIPROFLOXACIN <=0.25 SENSITIVE Sensitive     GENTAMICIN <=1 SENSITIVE Sensitive     IMIPENEM <=0.25 SENSITIVE Sensitive     NITROFURANTOIN <=16 SENSITIVE Sensitive     TRIMETH/SULFA <=20 SENSITIVE Sensitive     AMPICILLIN/SULBACTAM <=2 SENSITIVE Sensitive     PIP/TAZO <=4 SENSITIVE Sensitive     * >=100,000 COLONIES/mL ESCHERICHIA COLI  Culture, blood (Routine X 2) w Reflex to ID Panel     Status: None   Collection Time: 02/16/20  1:30 PM   Specimen: BLOOD RIGHT HAND  Result Value Ref Range Status   Specimen Description BLOOD RIGHT HAND  Final   Special Requests   Final    BOTTLES DRAWN AEROBIC ONLY Blood Culture results may not be optimal due to an inadequate volume of blood received in culture bottles   Culture   Final    NO  GROWTH 5 DAYS Performed at [facility], [address] [place], [place] [address]    Report Status 02/21/2020 FINAL  Final  Culture, blood (Routine X 2) w Reflex to ID Panel     Status: None   Collection Time: 02/16/20  1:41 PM   Specimen: BLOOD RIGHT HAND  Result Value Ref Range Status   Specimen Description BLOOD RIGHT HAND  Final   Special Requests   Final    BOTTLES DRAWN AEROBIC AND ANAEROBIC Blood Culture results may not be optimal due to an inadequate volume of blood received in culture bottles   Culture   Final    NO GROWTH 5 DAYS Performed at [facility], [address], [place], [place] [address]    Report Status 02/21/2020 FINAL  Final  MRSA PCR Screening     Status: Abnormal   Collection Time: 02/16/20  9:40 PM   Specimen: Nasopharyngeal  Result Value Ref Range Status   MRSA by PCR POSITIVE (A) NEGATIVE Final    Comment:        The GeneXpert MRSA Assay (FDA approved for NASAL specimens only), is one component of a comprehensive MRSA colonization surveillance program. It is not intended to diagnose MRSA infection nor to guide or monitor treatment for MRSA infections. RESULT CALLED TO, READ  BACK BY AND VERIFIED WITH: R PROCTER RN 02/16/20 AT 2342 SK Performed at [facility], [address] [place], [place] [address]     Radiology Reports CT Head Wo Contrast  Result Date: 02/16/2020 CLINICAL DATA:  Respiratory distress. EXAM: CT HEAD WITHOUT CONTRAST TECHNIQUE: Contiguous axial images were obtained from the base of the skull through the vertex without intravenous contrast. COMPARISON:  None. FINDINGS: Brain: The ventricles are normal in size and configuration. No extra-axial fluid collections are identified. The gray-white differentiation is maintained. No CT findings for acute hemispheric infarction or intracranial hemorrhage. No mass lesions. The brainstem and cerebellum are normal. Vascular: No hyperdense vessels or obvious aneurysm. Skull:  No acute skull fracture. No bone lesion. Sinuses/Orbits: The paranasal sinuses and mastoid air cells are clear. The globes are intact. Other: No scalp lesions, laceration or hematoma. IMPRESSION: Normal head CT. Electronically Signed   By: Rudie MeyerP.  Gallerani M.D.   On: 02/16/2020 12:39   CT ABDOMEN PELVIS W CONTRAST  Result Date: 02/19/2020 CLINICAL DATA:  74 year old male with increasing abdominal pain. EXAM: CT ABDOMEN AND PELVIS WITH CONTRAST TECHNIQUE: Multidetector CT imaging of the abdomen and pelvis was performed using the standard protocol following bolus administration of intravenous contrast. CONTRAST:  100mL OMNIPAQUE IOHEXOL 300 MG/ML  SOLN COMPARISON:  Chest radiographs 02/17/2020. FINDINGS: Lower chest: Mild lung base bronchiectasis. Mild associated lower lobe posterior basal segment scarring. No airspace disease. No pericardial or pleural effusion. Hepatobiliary: Negative liver and gallbladder. Pancreas: Negative. Spleen: Negative. Adrenals/Urinary Tract: Normal adrenal glands. Bilateral renal enhancement and contrast excretion is symmetric and within normal limits. Decompressed proximal ureters. No nephrolithiasis or discrete renal lesion. The urinary bladder is relatively decompressed but contains a moderate amount of gas (series 3, image 65). Mild circumferential bladder wall thickening. No perivesical stranding. Stomach/Bowel: Retained gas and stool in the rectum. Decompressed sigmoid colon. Similar gas and stool throughout the descending and redundant transverse colon. Oral contrast has reached the cecum and proximal ascending colon, mixed with stool there. Negative terminal ileum. And evidence of a normal gas containing appendix on series 3, image 51. There is an 11 cm right inguinal hernia containing multiple nondilated and contrast containing small bowel loops (coronal image 45. No dilated loops upstream or downstream of the hernia. No definite fluid or inflammatory stranding within the hernia  sac. Largely decompressed stomach and duodenum. No free air. No free fluid identified. Vascular/Lymphatic: Major arterial structures are patent. Calcified iliac artery atherosclerosis. Portal venous system is patent. No lymphadenopathy. Reproductive: Bowel containing right inguinal hernia as detailed above. Otherwise negative. Other: No definite pelvic free fluid. Musculoskeletal: Advanced degenerative changes in the lumbar spine including grade 1 spondylolisthesis measuring up to 10 mm at L4-L5. Associated chronic L4 pars fractures. Heterogeneous bone mineralization in the lower spine and pelvis, but no destructive osseous lesion identified. IMPRESSION: 1. Positive for 11 cm right inguinal hernia containing multiple small bowel loops, but no evidence of bowel obstruction or inflammation at this time. 2. Gas within the urinary bladder, suspicious for UTI unless explained by recent catheterization. Mild circumferential bladder wall thickening also noted. 3. No other acute or inflammatory process identified in the abdomen or pelvis. Redundant large bowel with retained stool. Lung base Bronchiectasis and mild pulmonary scarring. Advanced lumbar spine degeneration with chronic L4 pars fractures and grade 1 spondylolisthesis at L4-L5. Electronically Signed   By: Odessa FlemingH  Hall M.D.   On: 02/19/2020 16:53   DG Chest Port 1 View  Result Date: 02/17/2020 CLINICAL  DATA:  Respiratory failure. EXAM: PORTABLE CHEST 1 VIEW COMPARISON:  02/16/2020 FINDINGS: The endotracheal tube is in good position, unchanged. New NG tube is coursing down the esophagus and into the stomach. The tip is in the fundal region and the proximal port is near the GE junction. This should be advanced several cm. The heart is normal in size. Stable tortuous thoracic aorta. Stable emphysematous changes with hyperinflation but no infiltrates or effusions. No pneumothorax. IMPRESSION: 1. New NG tube tip in the fundus. This should be advanced several cm. 2.  Stable endotracheal tube. 3. Stable emphysematous changes and hyperinflation but no acute pulmonary findings. Electronically Signed   By: Rudie Meyer M.D.   On: 02/17/2020 07:04   DG Chest Portable 1 View  Result Date: 02/16/2020 CLINICAL DATA:  Endotracheal tube placement EXAM: PORTABLE CHEST 1 VIEW COMPARISON:  None. FINDINGS: Endotracheal tube is positioned with tip over the mid trachea, approximately 4.5 cm above the carina. No acute airspace opacity. The heart and mediastinum are unremarkable. IMPRESSION: 1. Endotracheal tube is positioned with tip over the mid trachea, approximately 4.5 cm above the carina. 2. No acute abnormality of the lungs. Electronically Signed   By: Lauralyn Primes M.D.   On: 02/16/2020 11:05     Time Spent in minutes  30     Laverna Peace M.D on 02/22/2020 at 11:53 AM  To page go to [email or web address] - password [facility]

## 2020-02-23 LAB — CBC WITH DIFFERENTIAL/PLATELET
Abs Immature Granulocytes: 0.01 10*3/uL (ref 0.00–0.07)
Basophils Absolute: 0 10*3/uL (ref 0.0–0.1)
Basophils Relative: 1 %
Eosinophils Absolute: 0.6 10*3/uL — ABNORMAL HIGH (ref 0.0–0.5)
Eosinophils Relative: 12 %
HCT: 35.4 % — ABNORMAL LOW (ref 39.0–52.0)
Hemoglobin: 11 g/dL — ABNORMAL LOW (ref 13.0–17.0)
Immature Granulocytes: 0 %
Lymphocytes Relative: 39 %
Lymphs Abs: 1.9 10*3/uL (ref 0.7–4.0)
MCH: 25 pg — ABNORMAL LOW (ref 26.0–34.0)
MCHC: 31.1 g/dL (ref 30.0–36.0)
MCV: 80.5 fL (ref 80.0–100.0)
Monocytes Absolute: 0.4 10*3/uL (ref 0.1–1.0)
Monocytes Relative: 8 %
Neutro Abs: 1.8 10*3/uL (ref 1.7–7.7)
Neutrophils Relative %: 40 %
Platelets: 94 10*3/uL — ABNORMAL LOW (ref 150–400)
RBC: 4.4 MIL/uL (ref 4.22–5.81)
RDW: 17.2 % — ABNORMAL HIGH (ref 11.5–15.5)
WBC: 4.7 10*3/uL (ref 4.0–10.5)
nRBC: 0 % (ref 0.0–0.2)

## 2020-02-23 LAB — HEPATITIS B SURFACE ANTIGEN: Hepatitis B Surface Ag: NONREACTIVE

## 2020-02-23 LAB — TRIGLYCERIDES: Triglycerides: 58 mg/dL (ref <150)

## 2020-02-23 LAB — PATHOLOGIST SMEAR REVIEW

## 2020-02-23 MED ORDER — MELATONIN 3 MG PO TABS
3.0000 mg | ORAL_TABLET | Freq: Every evening | ORAL | Status: DC | PRN
  Administered 2020-02-23 (×2): 3 mg via ORAL
  Filled 2020-02-23 (×2): qty 1

## 2020-02-23 NOTE — Plan of Care (Signed)

## 2020-02-23 NOTE — Progress Notes (Signed)
Occupational Therapy Treatment Patient Details Name: Victor Fitzgerald MRN: [id number] DOB: 02-10-1946 Today's Date: 02/23/2020    History of present illness Patient is a 74 year old male with unknown PMH admitted with acute on chronic hypercapnic respiratory failure requiring intubation in ED 9/13-9/14. UA showed large leukocytes with many bacteria urine culture consistent with E. coli.   OT comments  Pt requires (A) for all transfers at this time with R knee pain noted. Pt reports "once I get up and going I am okay" pt needs cues for hand placement safety. Pt will require elevated surfaces for toilet transfer. such as 3n1. Recommendation updated to SNF.    Follow Up Recommendations  SNF    Equipment Recommendations  3 in 1 bedside commode;Other (comment) (RW)    Recommendations for Other Services      Precautions / Restrictions Precautions Precautions: Fall Restrictions Weight Bearing Restrictions: No       Mobility Bed Mobility               General bed mobility comments: eob sitting lunch on arrival   Transfers Overall transfer level: Needs assistance Equipment used: Rolling walker (2 wheeled) Transfers: Sit to/from Stand Sit to Stand: Mod assist         General transfer comment: requires (A) to power up from surfaces    Balance Overall balance assessment: Needs assistance Sitting-balance support: No upper extremity supported;Feet supported Sitting balance-Leahy Scale: Good     Standing balance support: Bilateral upper extremity supported;During functional activity Standing balance-Leahy Scale: Poor                             ADL either performed or assessed with clinical judgement   ADL Overall ADL's : Needs assistance/impaired                         Toilet Transfer: Moderate assistance;Regular Teacher, adult education Details (indicate cue type and reason): requires (A) to elevate from toilet and cues to use grab bar    Toileting - Clothing Manipulation Details (indicate cue type and reason): simulated transfer only      Functional mobility during ADLs: Min guard;Rolling walker General ADL Comments: pt requires (A) to elevate from surfaces due to R knee pain     Vision       Perception     Praxis      Cognition Arousal/Alertness: Awake/alert Behavior During Therapy: WFL for tasks assessed/performed Overall Cognitive Status: Within Functional Limits for tasks assessed                                          Exercises     Shoulder Instructions       General Comments      Pertinent Vitals/ Pain       Pain Assessment: Faces Pain Score: 8  Pain Location: ankles, R knee (arthritic pain) Pain Descriptors / Indicators: Aching Pain Intervention(s): Premedicated before session;Repositioned  Home Living                                          Prior Functioning/Environment              Frequency  Min 2X/week  Progress Toward Goals  OT Goals(current goals can now be found in the care plan section)  Progress towards OT goals: Progressing toward goals  Acute Rehab OT Goals Patient Stated Goal: breathe better OT Goal Formulation: With patient Time For Goal Achievement: 03/03/20 Potential to Achieve Goals: Good ADL Goals Pt Will Perform Grooming: with modified independence;standing;sitting Pt Will Perform Upper Body Dressing: with modified independence;sitting Pt Will Perform Lower Body Dressing: with modified independence;sitting/lateral leans;sit to/from stand Pt Will Transfer to Toilet: with modified independence;ambulating;regular height toilet Pt Will Perform Toileting - Clothing Manipulation and hygiene: with modified independence;sitting/lateral leans;sit to/from stand Additional ADL Goal #1: Patient will identify 3 energy conservation strategies to maximize patient safety and independence with daily routine.  Plan Discharge  plan remains appropriate    Co-evaluation                 AM-PAC OT "6 Clicks" Daily Activity     Outcome Measure   Help from another person eating meals?: None Help from another person taking care of personal grooming?: A Little Help from another person toileting, which includes using toliet, bedpan, or urinal?: A Little Help from another person bathing (including washing, rinsing, drying)?: A Little Help from another person to put on and taking off regular upper body clothing?: A Little Help from another person to put on and taking off regular lower body clothing?: A Little 6 Click Score: 19    End of Session Equipment Utilized During Treatment: Rolling walker  OT Visit Diagnosis: Other abnormalities of gait and mobility (R26.89);Pain Pain - Right/Left: Right Pain - part of body: Knee   Activity Tolerance Patient tolerated treatment well   Patient Left in bed;with call bell/phone within reach;with bed alarm set   Nurse Communication Mobility status;Precautions        Time: 2979-8921 OT Time Calculation (min): 27 min  Charges: OT General Charges $OT Visit: 1 Visit OT Treatments $Self Care/Home Management : 23-37 mins   Brynn, OTR/L  Acute Rehabilitation Services Pager: [phone] Office: [phone] .    Mateo Flow 02/23/2020, 3:34 PM

## 2020-02-23 NOTE — TOC Progression Note (Signed)
Transition of Care [facility] - Progression Note    Patient Details  Name: Victor Fitzgerald MRN: [id number] Date of Birth: Aug 30, 1945  Transition of Care [facility]) CM/SW Contact  Kermit Balo, RN Phone Number: 02/23/2020, 4:06 PM  Clinical Narrative:    [name] unable to offer a bed at this time. CM reached out to [facility] and they have offered a bed. Pt is in agreement. Admissions person to meet with pt in the am. Pt to d/c tomorrow to [facility]. TOC following.   Expected Discharge Plan: Homeless Shelter Barriers to Discharge: Continued Medical Work up  Expected Discharge Plan and Services Expected Discharge Plan: Homeless Shelter   Discharge Planning Services: CM Consult   Living arrangements for the past 2 months: Hotel/Motel (But has check out and states has no where to go)                                       Social Determinants of Health (SDOH) Interventions    Readmission Risk Interventions No flowsheet data found.

## 2020-02-23 NOTE — Progress Notes (Signed)
[facility]  PROGRESS NOTE  Victor Fitzgerald BTD:[id number] DOB: 06/08/1945 DOA: 02/16/2020 PCP: Patient, No Pcp Per Admit date - 02/16/2020   Admitting Physician No admitting provider for patient encounter.  Outpatient Primary MD for the patient is Patient, No Pcp Per  LOS - 7 Brief Narrative   Victor Fitzgerald is a 74 y.o. year old male with medical history significant for reported asthma who presented on 02/16/2020 with progressive shortness of breath and respiratory distress with O2 sats in the 60s and tachypnea with respiratory rate greater than 40 and conversational dyspnea failed trial of BiPAP due to decreasing consciousness required intubation.   ABG was noted to have mild CO2 of 54 and pH of 7.3 and adequate oxygenation.  UA showed large leukocytes with many bacteria urine culture consistent with E. coli.  Covid test was negative.  CTA was also unremarkable.  Patient was intubated on 9/13   Subjective  Breathing stable.  No shortness of breath.  Groin pain resolved. A & P   Acute hypercarbic respiratory failure, resolved.  Presumed secondary to COPD exacerbation given bronchospasms and mild hypercarbia on admission and emphysematous changes on chest x-ray.  Patient was able to be extubated and weaned to nasal cannula with continuation of scheduled DuoNebs and prednisone burst on 9/14.  Patient reports history of asthma for which he only took over-the-counter inhalers.  Currently doing much better and off supplemental O2.  Still feels somewhat symptomatic when he walks but not requiring any O2 and wheezing essentially resolved no conversational dyspnea since starting Spiriva.  Patient is minimally symptomatic but does have high risk for exacerbation to initiation of long-acting inhaler therapy, pacifically with LAMA given decreased risk for further hospitalizations  -Doing well on Spiriva, will continue Spiriva on discharge -PRN duonebs, -doxycycline for inflammatory involvement ( 7  days, end date9/20) -Incentive spirometer, flutter valve, out of bed to chair -Completed prednisone burst  Large right inguinal hernia, stable.  No signs of strangulation or incarceration on exam or CT.  Continues to be reducible.  Discussed case with surgery who recommends supportive care (hernia truss), given large size at lower risk for incarceration -Referral placed with Dr. Derrell Lolling to discuss elective hernia repair as outpatient -continue to monitor, scrotal support while inpatient -Advised use of hernia truss/belt on discharge  E. coli UTI.  Pansensitive -completed cefdinir for total 5 days, end date 9/19  Pancytopenia seems new,resolved Normocytic anemia, stable Thrombocytopenia, stable . Last documented CBC from 2015 wnl. Hgb 11.6 and normocytic, platelets are now 97, leukopenia resolved, hgb stable. no localizing signs or symptoms of bleeding or bruising. HIV, INR, LFTs are wnl IgM core antibody positive.  Ferritin and iron level within normal limits -We will repeat IgM hep B core antibody test to ensure not a false positive -Monitor CBC  Lactic acidosis, resolved Lactic acid of 2.7 on admission.  No bacteremia  Elevated troponin, resolved.  On admission troponin 41 likely demand ischemia in the setting of respiratory distress as mentioned above  Acute metabolic encephalopathy, resolved.  In the setting of hypercarbia/infectious etiology    Family Communication  : None  Code Status : Full code  Disposition Plan  :  Patient is from home. Anticipated d/c date:  Medically stable for discharge.  SNF to recommend continued rehabilitation for physical dependence per PT evaluation recommendations barriers to d/c or necessity for inpatient status:  medically stable for discharge, anticipate discharge to SNF once bed available next 24 to 48 hours Consults  : PCCM  Procedures  : Intubated 9/13, extubated 9/14  DVT Prophylaxis  : Heparin  Lab Results  Component Value Date   PLT  94 (L) 02/23/2020    Diet :  Diet Order            Diet regular Room service appropriate? Yes; Fluid consistency: Thin  Diet effective now                  Inpatient Medications Scheduled Meds:  Chlorhexidine Gluconate Cloth  6 each Topical Daily   docusate sodium  100 mg Oral BID   guaiFENesin  1,200 mg Oral BID   heparin  5,000 Units Subcutaneous Q8H   mouth rinse  15 mL Mouth Rinse BID   polyethylene glycol  17 g Oral Daily   umeclidinium bromide  1 puff Inhalation Daily   Continuous Infusions:  PRN Meds:.docusate sodium, ipratropium-albuterol, melatonin, midazolam, polyethylene glycol  Antibiotics  :   Anti-infectives (From admission, onward)   Start     Dose/Rate Route Frequency Ordered Stop   02/17/20 1300  doxycycline (VIBRA-TABS) tablet 100 mg        100 mg Oral Every 12 hours 02/17/20 1212 02/22/20 2244   02/17/20 1245  cefdinir (OMNICEF) capsule 300 mg        300 mg Oral Every 12 hours 02/17/20 1149 02/21/20 2137   02/17/20 1245  azithromycin (ZITHROMAX) tablet 250 mg  Status:  Discontinued        250 mg Oral Daily 02/17/20 1149 02/17/20 1212   02/16/20 1245  cefTRIAXone (ROCEPHIN) 2 g in sodium chloride 0.9 % 100 mL IVPB  Status:  Discontinued        2 g 200 mL/hr over 30 Minutes Intravenous Every 24 hours 02/16/20 1231 02/17/20 1149       Objective   Vitals:   02/23/20 0432 02/23/20 0815 02/23/20 0829 02/23/20 1158  BP:   125/75 115/82  Pulse:   65 68  Resp:   17 20  Temp:   98 F (36.7 C) 98 F (36.7 C)  TempSrc:   Oral Oral  SpO2:  98% 100% 100%  Weight: 79.5 kg       SpO2: 100 % O2 Flow Rate (L/min): 2 L/min FiO2 (%): 40 %  Wt Readings from Last 3 Encounters:  02/23/20 79.5 kg     Intake/Output Summary (Last 24 hours) at 02/23/2020 1222 Last data filed at 02/23/2020 1024 Gross per 24 hour  Intake 556 ml  Output 1025 ml  Net -469 ml    Physical Exam:     Awake Alert, Oriented X 3, Normal affect No new F.N deficits,    [place].AT, Normal respiratory effort on room air, scant wheezing, no conversational dyspnea RRR,No Gallops,Rubs or new Murmurs,  +ve B.Sounds, Abd Soft, No tenderness, No rebound, guarding or rigidity. Right inguinal hernia present and reducible  No Cyanosis, No new Rash or bruise     I have personally reviewed the following:   Data Reviewed:  CBC Recent Labs  Lab 02/17/20 0143 02/17/20 0201 02/21/20 0225 02/22/20 0741 02/23/20 0140  WBC 3.9*  --  6.6 5.5 4.7  HGB 11.1* 11.6* 11.1* 10.9* 11.0*  HCT 36.3* 34.0* 35.4* 35.1* 35.4*  PLT 54*  --  89* 97* 94*  MCV 82.1  --  79.7* 80.1 80.5  MCH 25.1*  --  25.0* 24.9* 25.0*  MCHC 30.6  --  31.4 31.1 31.1  RDW 17.2*  --  16.8* 16.7* 17.2*  LYMPHSABS  --   --  2.3 2.2 1.9  MONOABS  --   --  0.6 0.4 0.4  EOSABS  --   --  0.3 0.4 0.6*  BASOSABS  --   --  0.0 0.0 0.0    Chemistries  Recent Labs  Lab 02/16/20 1835 02/17/20 0143 02/17/20 0201  NA 142 143 145  K 3.7 3.9 3.7  CL 105 108  --   CO2 27 26  --   GLUCOSE 130* 127*  --   BUN 18 19  --   CREATININE 0.87 0.75  --   CALCIUM 8.7* 8.8*  --   MG  --  1.8  --    ------------------------------------------------------------------------------------------------------------------ Recent Labs    02/22/20 0741 02/23/20 0140  TRIG 88 58    No results found for: HGBA1C ------------------------------------------------------------------------------------------------------------------ No results for input(s): TSH, T4TOTAL, T3FREE, THYROIDAB in the last 72 hours.  Invalid input(s): FREET3 ------------------------------------------------------------------------------------------------------------------ Recent Labs    02/22/20 1531  FERRITIN 108  TIBC 210*  IRON 67    Coagulation profile Recent Labs  Lab 02/20/20 1829  INR 1.1    No results for input(s): DDIMER in the last 72 hours.  Cardiac Enzymes No results for input(s): CKMB, TROPONINI, MYOGLOBIN in the last  168 hours.  Invalid input(s): CK ------------------------------------------------------------------------------------------------------------------ No results found for: BNP  Micro Results Recent Results (from the past 240 hour(s))  SARS Coronavirus 2 by RT PCR (hospital order, performed in [facility] hospital lab) Nasopharyngeal Nasopharyngeal Swab     Status: None   Collection Time: 02/16/20 10:39 AM   Specimen: Nasopharyngeal Swab  Result Value Ref Range Status   SARS Coronavirus 2 NEGATIVE NEGATIVE Final    Comment: (NOTE) SARS-CoV-2 target nucleic acids are NOT DETECTED.  The SARS-CoV-2 RNA is generally detectable in upper and lower respiratory specimens during the acute phase of infection. The lowest concentration of SARS-CoV-2 viral copies this assay can detect is 250 copies / mL. A negative result does not preclude SARS-CoV-2 infection and should not be used as the sole basis for treatment or other patient management decisions.  A negative result may occur with improper specimen collection / handling, submission of specimen other than nasopharyngeal swab, presence of viral mutation(s) within the areas targeted by this assay, and inadequate number of viral copies (<250 copies / mL). A negative result must be combined with clinical observations, patient history, and epidemiological information.  Fact Sheet for Patients:   BoilerBrush.com.cy  Fact Sheet for Healthcare Providers: https://pope.com/  This test is not yet approved or  cleared by the [place] FDA and has been authorized for detection and/or diagnosis of SARS-CoV-2 by FDA under an Emergency Use Authorization (EUA).  This EUA will remain in effect (meaning this test can be used) for the duration of the COVID-19 declaration under Section 564(b)(1) of the Act, 21 U.S.C. section 360bbb-3(b)(1), unless the authorization is terminated or revoked  sooner.  Performed at [facility], [address] [place], [place] [address]   Urine culture     Status: Abnormal   Collection Time: 02/16/20 12:42 PM   Specimen: Urine, Random  Result Value Ref Range Status   Specimen Description URINE, RANDOM  Final   Special Requests   Final    NONE Performed at [facility], [address] [place], [place] [address]    Culture >=100,000 COLONIES/mL ESCHERICHIA COLI (A)  Final   Report Status 02/18/2020 FINAL  Final   Organism ID, Bacteria ESCHERICHIA COLI (A)  Final  Susceptibility   Escherichia coli - MIC*    AMPICILLIN 4 SENSITIVE Sensitive     CEFAZOLIN <=4 SENSITIVE Sensitive     CEFTRIAXONE <=0.25 SENSITIVE Sensitive     CIPROFLOXACIN <=0.25 SENSITIVE Sensitive     GENTAMICIN <=1 SENSITIVE Sensitive     IMIPENEM <=0.25 SENSITIVE Sensitive     NITROFURANTOIN <=16 SENSITIVE Sensitive     TRIMETH/SULFA <=20 SENSITIVE Sensitive     AMPICILLIN/SULBACTAM <=2 SENSITIVE Sensitive     PIP/TAZO <=4 SENSITIVE Sensitive     * >=100,000 COLONIES/mL ESCHERICHIA COLI  Culture, blood (Routine X 2) w Reflex to ID Panel     Status: None   Collection Time: 02/16/20  1:30 PM   Specimen: BLOOD RIGHT HAND  Result Value Ref Range Status   Specimen Description BLOOD RIGHT HAND  Final   Special Requests   Final    BOTTLES DRAWN AEROBIC ONLY Blood Culture results may not be optimal due to an inadequate volume of blood received in culture bottles   Culture   Final    NO GROWTH 5 DAYS Performed at [facility], [address] [place], [place] [address]    Report Status 02/21/2020 FINAL  Final  Culture, blood (Routine X 2) w Reflex to ID Panel     Status: None   Collection Time: 02/16/20  1:41 PM   Specimen: BLOOD RIGHT HAND  Result Value Ref Range Status   Specimen Description BLOOD RIGHT HAND  Final   Special Requests   Final    BOTTLES DRAWN AEROBIC AND ANAEROBIC Blood Culture results may not be optimal due to an inadequate  volume of blood received in culture bottles   Culture   Final    NO GROWTH 5 DAYS Performed at [facility], [address] [place], [place] [address]    Report Status 02/21/2020 FINAL  Final  MRSA PCR Screening     Status: Abnormal   Collection Time: 02/16/20  9:40 PM   Specimen: Nasopharyngeal  Result Value Ref Range Status   MRSA by PCR POSITIVE (A) NEGATIVE Final    Comment:        The GeneXpert MRSA Assay (FDA approved for NASAL specimens only), is one component of a comprehensive MRSA colonization surveillance program. It is not intended to diagnose MRSA infection nor to guide or monitor treatment for MRSA infections. RESULT CALLED TO, READ BACK BY AND VERIFIED WITH: R PROCTER RN 02/16/20 AT 2342 SK Performed at [facility], [address] [place], [place] [address]   SARS CORONAVIRUS 2 (TAT 6-24 HRS) Nasopharyngeal Nasopharyngeal Swab     Status: None   Collection Time: 02/22/20  2:04 PM   Specimen: Nasopharyngeal Swab  Result Value Ref Range Status   SARS Coronavirus 2 NEGATIVE NEGATIVE Final    Comment: (NOTE) SARS-CoV-2 target nucleic acids are NOT DETECTED.  The SARS-CoV-2 RNA is generally detectable in upper and lower respiratory specimens during the acute phase of infection. Negative results do not preclude SARS-CoV-2 infection, do not rule out co-infections with other pathogens, and should not be used as the sole basis for treatment or other patient management decisions. Negative results must be combined with clinical observations, patient history, and epidemiological information. The expected result is Negative.  Fact Sheet for Patients: HairSlick.nohttps://www.fda.gov/media/138098/download  Fact Sheet for Healthcare Providers: quierodirigir.comhttps://www.fda.gov/media/138095/download  This test is not yet approved or cleared by the [place] FDA and  has been authorized for detection and/or diagnosis of SARS-CoV-2 by  FDA under an Emergency Use Authorization  (EUA). This EUA will remain  in effect (meaning this test can be used) for the duration of the COVID-19 declaration under Se ction 564(b)(1) of the Act, 21 U.S.C. section 360bbb-3(b)(1), unless the authorization is terminated or revoked sooner.  Performed at [facility], [address] [place], [place] [address]     Radiology Reports CT Head Wo Contrast  Result Date: 02/16/2020 CLINICAL DATA:  Respiratory distress. EXAM: CT HEAD WITHOUT CONTRAST TECHNIQUE: Contiguous axial images were obtained from the base of the skull through the vertex without intravenous contrast. COMPARISON:  None. FINDINGS: Brain: The ventricles are normal in size and configuration. No extra-axial fluid collections are identified. The gray-white differentiation is maintained. No CT findings for acute hemispheric infarction or intracranial hemorrhage. No mass lesions. The brainstem and cerebellum are normal. Vascular: No hyperdense vessels or obvious aneurysm. Skull: No acute skull fracture. No bone lesion. Sinuses/Orbits: The paranasal sinuses and mastoid air cells are clear. The globes are intact. Other: No scalp lesions, laceration or hematoma. IMPRESSION: Normal head CT. Electronically Signed   By: Rudie Meyer M.D.   On: 02/16/2020 12:39   CT ABDOMEN PELVIS W CONTRAST  Result Date: 02/19/2020 CLINICAL DATA:  74 year old male with increasing abdominal pain. EXAM: CT ABDOMEN AND PELVIS WITH CONTRAST TECHNIQUE: Multidetector CT imaging of the abdomen and pelvis was performed using the standard protocol following bolus administration of intravenous contrast. CONTRAST:  OMNIPAQUE IOHEXOL 300 MG/ML  SOLN COMPARISON:  Chest radiographs 02/17/2020. FINDINGS: Lower chest: Mild lung base bronchiectasis. Mild associated lower lobe posterior basal segment scarring. No airspace disease. No pericardial or pleural effusion. Hepatobiliary: Negative liver and gallbladder. Pancreas: Negative. Spleen: Negative.  Adrenals/Urinary Tract: Normal adrenal glands. Bilateral renal enhancement and contrast excretion is symmetric and within normal limits. Decompressed proximal ureters. No nephrolithiasis or discrete renal lesion. The urinary bladder is relatively decompressed but contains a moderate amount of gas (series 3, image 65). Mild circumferential bladder wall thickening. No perivesical stranding. Stomach/Bowel: Retained gas and stool in the rectum. Decompressed sigmoid colon. Similar gas and stool throughout the descending and redundant transverse colon. Oral contrast has reached the cecum and proximal ascending colon, mixed with stool there. Negative terminal ileum. And evidence of a normal gas containing appendix on series 3, image 51. There is an 11 cm right inguinal hernia containing multiple nondilated and contrast containing small bowel loops (coronal image 45. No dilated loops upstream or downstream of the hernia. No definite fluid or inflammatory stranding within the hernia sac. Largely decompressed stomach and duodenum. No free air. No free fluid identified. Vascular/Lymphatic: Major arterial structures are patent. Calcified iliac artery atherosclerosis. Portal venous system is patent. No lymphadenopathy. Reproductive: Bowel containing right inguinal hernia as detailed above. Otherwise negative. Other: No definite pelvic free fluid. Musculoskeletal: Advanced degenerative changes in the lumbar spine including grade 1 spondylolisthesis measuring up to 10 mm at L4-L5. Associated chronic L4 pars fractures. Heterogeneous bone mineralization in the lower spine and pelvis, but no destructive osseous lesion identified. IMPRESSION: 1. Positive for 11 cm right inguinal hernia containing multiple small bowel loops, but no evidence of bowel obstruction or inflammation at this time. 2. Gas within the urinary bladder, suspicious for UTI unless explained by recent catheterization. Mild circumferential bladder wall thickening also  noted. 3. No other acute or inflammatory process identified in the abdomen or pelvis. Redundant large bowel with retained stool. Lung base Bronchiectasis and mild pulmonary scarring. Advanced lumbar spine degeneration with  chronic L4 pars fractures and grade 1 spondylolisthesis at L4-L5. Electronically Signed   By: Odessa Fleming M.D.   On: 02/19/2020 16:53   DG Chest Port 1 View  Result Date: 02/17/2020 CLINICAL DATA:  Respiratory failure. EXAM: PORTABLE CHEST 1 VIEW COMPARISON:  02/16/2020 FINDINGS: The endotracheal tube is in good position, unchanged. New NG tube is coursing down the esophagus and into the stomach. The tip is in the fundal region and the proximal port is near the GE junction. This should be advanced several cm. The heart is normal in size. Stable tortuous thoracic aorta. Stable emphysematous changes with hyperinflation but no infiltrates or effusions. No pneumothorax. IMPRESSION: 1. New NG tube tip in the fundus. This should be advanced several cm. 2. Stable endotracheal tube. 3. Stable emphysematous changes and hyperinflation but no acute pulmonary findings. Electronically Signed   By: Rudie Meyer M.D.   On: 02/17/2020 07:04   DG Chest Portable 1 View  Result Date: 02/16/2020 CLINICAL DATA:  Endotracheal tube placement EXAM: PORTABLE CHEST 1 VIEW COMPARISON:  None. FINDINGS: Endotracheal tube is positioned with tip over the mid trachea, approximately 4.5 cm above the carina. No acute airspace opacity. The heart and mediastinum are unremarkable. IMPRESSION: 1. Endotracheal tube is positioned with tip over the mid trachea, approximately 4.5 cm above the carina. 2. No acute abnormality of the lungs. Electronically Signed   By: Lauralyn Primes M.D.   On: 02/16/2020 11:05     Time Spent in minutes  30     Laverna Peace M.D on 02/23/2020 at 12:22 PM  To page go to [email or web address] - password [email or web address]

## 2020-02-24 DIAGNOSIS — R768 Other specified abnormal immunological findings in serum: Secondary | ICD-10-CM

## 2020-02-24 LAB — TRIGLYCERIDES: Triglycerides: 74 mg/dL (ref <150)

## 2020-02-24 LAB — HEPATITIS B CORE ANTIBODY, IGM: Hep B C IgM: REACTIVE — AB

## 2020-02-24 LAB — HEPATITIS B SURFACE ANTIBODY, QUANTITATIVE: Hep B S AB Quant (Post): <3.1 m[IU]/mL — ABNORMAL LOW (ref >9.9)

## 2020-02-24 LAB — HEPATITIS B E ANTIBODY: Hep B E Ab: NEGATIVE

## 2020-02-24 MED ORDER — DOCUSATE SODIUM 100 MG PO CAPS: 100.0000 mg | ORAL_CAPSULE | Freq: Two times a day (BID) | ORAL | 0 refills | Status: AC | PRN

## 2020-02-24 MED ORDER — POLYETHYLENE GLYCOL 3350 17 G PO PACK: 17.0000 g | PACK | Freq: Every day | ORAL | 0 refills | Status: AC | PRN

## 2020-02-24 MED ORDER — SPIRIVA HANDIHALER 18 MCG IN CAPS: 18.0000 ug | ORAL_CAPSULE | Freq: Every day | RESPIRATORY_TRACT | 0 refills | Status: AC

## 2020-02-24 MED ORDER — MELATONIN 3 MG PO TABS: 3.0000 mg | ORAL_TABLET | Freq: Every evening | ORAL | 0 refills | Status: AC | PRN

## 2020-02-24 MED ORDER — GUAIFENESIN ER 600 MG PO TB12: 1200.0000 mg | ORAL_TABLET | Freq: Two times a day (BID) | ORAL | Status: AC | PRN

## 2020-02-24 NOTE — Progress Notes (Signed)
Called report to [facility], patient transported by [facility]

## 2020-02-24 NOTE — Care Management Important Message (Signed)
Important Message  Patient Details  Name: Victor Fitzgerald MRN: [id number] Date of Birth: 08-14-1945   Medicare Important Message Given:  Yes     Anterio Scheel Stefan Church 02/24/2020, 1:10 PM

## 2020-02-24 NOTE — Discharge Summary (Signed)
Victor SeraClifton Zeleznik ZOX:[id number] DOB: September 05, 1945 DOA: 02/16/2020  PCP: Patient, No Pcp Per  Admit date: 02/16/2020 Discharge date: 02/24/2020  Admitted From: Home Disposition: SNF  Recommendations for Outpatient Follow-up:  1. Follow up with PCP in 1-2 weeks 2. Referral placed with Dr. Derrell Lollingamirez, general surgery, to discuss elective hernia repair as outpatient 3. Please obtain CBC in one week to monitor platelets and anemia 4. New medications: Spiriva inhaler 5. Please follow up on the following pending results: HBV DNA *  Equipment/Devices: None Discharge Condition: Stable CODE STATUS: Full   Brief/Interim Summary: History of present illness:  Victor Fitzgerald is a 74 y.o. year old male with medical history significant for reported asthma who presented on 02/16/2020 with progressive shortness of breath and respiratory distress with O2 sats in the 60s and tachypnea with respiratory rate greater than 40 and conversational dyspnea failed trial of BiPAP due to decreasing consciousness required intubation.   ABG was noted to have mild CO2 of 54 and pH of 7.3 and adequate oxygenation.  UA showed large leukocytes with many bacteria urine culture consistent with E. coli.  Covid test was negative.  CTA was also unremarkable.  Patient was intubated on 9/13.  Presumed secondary to COPD exacerbation given bronchospasms and mild hypercarbia on admission and emphysematous changes on chest x-ray.  Patient was able to be extubated and weaned to nasal cannula with continuation of scheduled DuoNebs and prednisone burst on 9/14.  Patient reports history of asthma for which he only took over-the-counter inhalers   Remaining hospital course addressed in problem based format below:   Hospital Course:  Acute hypercarbic respiratory failure, resolved. Presumed secondary to COPD exacerbation given bronchospasms with mild hypercarbia on admission as well as emphysematous changes seen on chest x-ray. Doing much better now  on room air. Has completed scheduled duo nebs and steroid burst. -Remains well controlled on Spiriva inhaler started in hospital, will continue on discharge given decreased risk for further hospitalizations -Also completed doxycycline course over 7 days -Continue incentive spirometer and flutter valve as needed  Large right inguinal hernia, stable. No signs of strangulation or incarceration on physical exam or CT. Given stable clinically and continues to be reducible discussed with surgery who recommended supportive care (hernia truss), scrotal support was provided during hospitalization. Patient at low risk for incarceration given large size of hernia. -Outpatient referral placed by surgery to Dr. Derrell Lollingamirez to discuss elective hernia repair as outpatient -Continue scrotal support on discharge, advise use of hernia truss/belt  E. coli UTI. Pansensitive. Resolved. -Completed 5-day course of cefdinir  Pancytopenia, resolved Normocytic anemia, stable Thrombocytopenia, stable Hemoglobin 11 and platelets 94 on discharge. Iron panel is unremarkable. HIV, INR, LFTs were all within normal limits. -Repeat CBC as outpatient in 1 week to continue to closely monitor.  Positive hep C core antibody IgM. Hepatitis panel was taken for evaluation of thrombocytopenia. Hep C antibody negative, hep B surface antigen negative, surface antigen antibody also negative. Suspect likely false positive given no derangements in LFTs, normal INR, no bilirubinemia, no symptoms -HBV DNA is currently pending, if this is negative most consistent with false positive, will need to follow as outpatient  Lactic acidosis, resolved Lactic acid 2.7 on admission. No bacteremia.  Elevated troponin, resolved. Suspect demand ischemia in the setting of respiratory distress requiring intubation on admission. Troponin of 41 on admission with no chest pain, no acute ischemic changes on EKG.  Acute metabolic encephalopathy, resolved.  Occurred in the setting of hypercarbia/infectious etiology. Patient is currently  alert and oriented x4.   Consultations:  None  Procedures/Studies: Intubation 9/13 Subjective: Feels well. Breathing stable. No groin pain Discharge Exam: Vitals:   02/24/20 0807 02/24/20 0828  BP:  104/62  Pulse:  68  Resp:  16  Temp:  98 F (36.7 C)  SpO2: 97% 99%   Vitals:   02/24/20 0354 02/24/20 0500 02/24/20 0807 02/24/20 0828  BP: 108/72   104/62  Pulse: 78   68  Resp: 19   16  Temp: 98.5 F (36.9 C)   98 F (36.7 C)  TempSrc: Oral   Oral  SpO2: 100%  97% 99%  Weight:  79.9 kg      General: Lying in bed, no apparent distress Eyes: EOMI, anicteric ENT: Oral Mucosa clear and moist Cardiovascular: regular rate and rhythm, no murmurs, rubs or gallops, no edema, Respiratory: Normal respiratory effort on room air, lungs clear to auscultation bilaterally Abdomen: soft, non-distended, non-tender, normal bowel sounds Skin: No Rash Groin: Right inguinal hernia present, soft, reducible Neurologic: Grossly no focal neuro deficit.Mental status AAOx3, speech normal, Psychiatric:Appropriate affect, and mood  Discharge Diagnoses:  Active Problems:   Acute encephalopathy   Acute respiratory failure with hypercapnia (HCC)   Emphysema lung (HCC)   Lactic acidosis   Elevated troponin   E. coli UTI   Abdominal pain   Inguinal hernia, right   Pancytopenia (HCC)   Hepatitis B core antibody positive    Discharge Instructions  Discharge Instructions    Diet - low sodium heart healthy   Complete by: As directed    Increase activity slowly   Complete by: As directed      Allergies as of 02/24/2020      Reactions   Shellfish Allergy Anaphylaxis      Medication List    TAKE these medications   docusate sodium 100 MG capsule Commonly known as: COLACE Take 1 capsule (100 mg total) by mouth 2 (two) times daily as needed for mild constipation.   guaiFENesin 600 MG 12 hr  tablet Commonly known as: MUCINEX Take 2 tablets (1,200 mg total) by mouth 2 (two) times daily as needed.   melatonin 3 MG Tabs tablet Take 1 tablet (3 mg total) by mouth at bedtime as needed (sleep).   polyethylene glycol 17 g packet Commonly known as: MIRALAX / GLYCOLAX Take 17 g by mouth daily as needed for moderate constipation.   Spiriva HandiHaler 18 MCG inhalation capsule Generic drug: tiotropium Place 1 capsule (18 mcg total) into inhaler and inhale daily.       Contact information for follow-up providers    [facility]. Schedule an appointment as soon as possible for a visit.   Contact information: [address] [place] [address] [phone]       Axel Filler, MD. Go on 03/05/2020.   Specialty: General Surgery Why: Appointment scheduled for 8:40 AM to discuss elective inguinal hernia repair. Please arrive 30 min prior to appointment time to check in. Bring photo ID and insurance information.  Contact information: [address] [place] [address] [phone]            Contact information for after-discharge care    Destination    [facility] .   Service: Skilled Nursing Contact information: [address] [place] [address] [phone]                 Allergies  Allergen Reactions  .  Shellfish Allergy Anaphylaxis        The results of significant diagnostics from this hospitalization (including imaging, microbiology, ancillary and laboratory) are listed below for reference.     Microbiology: Recent Results (from the past 240 hour(s))  SARS Coronavirus 2 by RT PCR (hospital order, performed in [facility] hospital lab) Nasopharyngeal Nasopharyngeal Swab     Status: None   Collection Time: 02/16/20 10:39 AM   Specimen: Nasopharyngeal Swab  Result Value Ref Range Status   SARS Coronavirus 2 NEGATIVE NEGATIVE Final    Comment:  (NOTE) SARS-CoV-2 target nucleic acids are NOT DETECTED.  The SARS-CoV-2 RNA is generally detectable in upper and lower respiratory specimens during the acute phase of infection. The lowest concentration of SARS-CoV-2 viral copies this assay can detect is 250 copies / mL. A negative result does not preclude SARS-CoV-2 infection and should not be used as the sole basis for treatment or other patient management decisions.  A negative result may occur with improper specimen collection / handling, submission of specimen other than nasopharyngeal swab, presence of viral mutation(s) within the areas targeted by this assay, and inadequate number of viral copies (<250 copies / mL). A negative result must be combined with clinical observations, patient history, and epidemiological information.  Fact Sheet for Patients:   BoilerBrush.com.cy  Fact Sheet for Healthcare Providers: https://pope.com/  This test is not yet approved or  cleared by the [place] FDA and has been authorized for detection and/or diagnosis of SARS-CoV-2 by FDA under an Emergency Use Authorization (EUA).  This EUA will remain in effect (meaning this test can be used) for the duration of the COVID-19 declaration under Section 564(b)(1) of the Act, 21 U.S.C. section 360bbb-3(b)(1), unless the authorization is terminated or revoked sooner.  Performed at [facility], [address], [place], [place] [address]   Urine culture     Status: Abnormal   Collection Time: 02/16/20 12:42 PM   Specimen: Urine, Random  Result Value Ref Range Status   Specimen Description URINE, RANDOM  Final   Special Requests   Final    NONE Performed at [facility], [address], [place], [place] [address]    Culture >=100,000 COLONIES/mL ESCHERICHIA COLI (A)  Final   Report Status 02/18/2020 FINAL  Final   Organism ID, Bacteria ESCHERICHIA COLI (A)  Final       Susceptibility   Escherichia coli - MIC*    AMPICILLIN 4 SENSITIVE Sensitive     CEFAZOLIN <=4 SENSITIVE Sensitive     CEFTRIAXONE <=0.25 SENSITIVE Sensitive     CIPROFLOXACIN <=0.25 SENSITIVE Sensitive     GENTAMICIN <=1 SENSITIVE Sensitive     IMIPENEM <=0.25 SENSITIVE Sensitive     NITROFURANTOIN <=16 SENSITIVE Sensitive     TRIMETH/SULFA <=20 SENSITIVE Sensitive     AMPICILLIN/SULBACTAM <=2 SENSITIVE Sensitive     PIP/TAZO <=4 SENSITIVE Sensitive     * >=100,000 COLONIES/mL ESCHERICHIA COLI  Culture, blood (Routine X 2) w Reflex to ID Panel     Status: None   Collection Time: 02/16/20  1:30 PM   Specimen: BLOOD RIGHT HAND  Result Value Ref Range Status   Specimen Description BLOOD RIGHT HAND  Final   Special Requests   Final    BOTTLES DRAWN AEROBIC ONLY Blood Culture results may not be optimal due to an inadequate volume of blood received in culture bottles   Culture   Final    NO GROWTH 5 DAYS Performed at  [facility], [address] [place], [place], [place] [address]    Report Status 02/21/2020 FINAL  Final  Culture, blood (Routine X 2) w Reflex to ID Panel     Status: None   Collection Time: 02/16/20  1:41 PM   Specimen: BLOOD RIGHT HAND  Result Value Ref Range Status   Specimen Description BLOOD RIGHT HAND  Final   Special Requests   Final    BOTTLES DRAWN AEROBIC AND ANAEROBIC Blood Culture results may not be optimal due to an inadequate volume of blood received in culture bottles   Culture   Final    NO GROWTH 5 DAYS Performed at [facility], [address] [place], [place] [address]    Report Status 02/21/2020 FINAL  Final  MRSA PCR Screening     Status: Abnormal   Collection Time: 02/16/20  9:40 PM   Specimen: Nasopharyngeal  Result Value Ref Range Status   MRSA by PCR POSITIVE (A) NEGATIVE Final    Comment:        The GeneXpert MRSA Assay (FDA approved for NASAL specimens only), is one component of a comprehensive MRSA colonization surveillance  program. It is not intended to diagnose MRSA infection nor to guide or monitor treatment for MRSA infections. RESULT CALLED TO, READ BACK BY AND VERIFIED WITH: R PROCTER RN 02/16/20 AT 2342 SK Performed at [facility], [address], [place], [place] [address]   SARS CORONAVIRUS 2 (TAT 6-24 HRS) Nasopharyngeal Nasopharyngeal Swab     Status: None   Collection Time: 02/22/20  2:04 PM   Specimen: Nasopharyngeal Swab  Result Value Ref Range Status   SARS Coronavirus 2 NEGATIVE NEGATIVE Final    Comment: (NOTE) SARS-CoV-2 target nucleic acids are NOT DETECTED.  The SARS-CoV-2 RNA is generally detectable in upper and lower respiratory specimens during the acute phase of infection. Negative results do not preclude SARS-CoV-2 infection, do not rule out co-infections with other pathogens, and should not be used as the sole basis for treatment or other patient management decisions. Negative results must be combined with clinical observations, patient history, and epidemiological information. The expected result is Negative.  Fact Sheet for Patients: HairSlick.no  Fact Sheet for Healthcare Providers: quierodirigir.com  This test is not yet approved or cleared by the [place] FDA and  has been authorized for detection and/or diagnosis of SARS-CoV-2 by FDA under an Emergency Use Authorization (EUA). This EUA will remain  in effect (meaning this test can be used) for the duration of the COVID-19 declaration under Se ction 564(b)(1) of the Act, 21 U.S.C. section 360bbb-3(b)(1), unless the authorization is terminated or revoked sooner.  Performed at [facility], [address], [place], [place] [address]      Labs: BNP (last 3 results) No results for input(s): BNP in the last 8760 hours. Basic Metabolic Panel: No results for input(s): NA, K, CL, CO2, GLUCOSE, BUN, CREATININE, CALCIUM, MG, PHOS in the last 168  hours. Liver Function Tests: No results for input(s): AST, ALT, ALKPHOS, BILITOT, PROT, ALBUMIN in the last 168 hours. No results for input(s): LIPASE, AMYLASE in the last 168 hours. No results for input(s): AMMONIA in the last 168 hours. CBC: Recent Labs  Lab 02/21/20 0225 02/22/20 0741 02/23/20 0140  WBC 6.6 5.5 4.7  NEUTROABS 3.3 2.5 1.8  HGB 11.1* 10.9* 11.0*  HCT 35.4* 35.1* 35.4*  MCV 79.7* 80.1 80.5  PLT 89* 97* 94*   Cardiac Enzymes: No  results for input(s): CKTOTAL, CKMB, CKMBINDEX, TROPONINI in the last 168 hours. BNP: Invalid input(s): POCBNP CBG: Recent Labs  Lab 02/17/20 1108  GLUCAP 88   D-Dimer No results for input(s): DDIMER in the last 72 hours. Hgb A1c No results for input(s): HGBA1C in the last 72 hours. Lipid Profile Recent Labs    02/23/20 0140 02/24/20 0448  TRIG 58 74   Thyroid function studies No results for input(s): TSH, T4TOTAL, T3FREE, THYROIDAB in the last 72 hours.  Invalid input(s): FREET3 Anemia work up Recent Labs    02/22/20 1531  FERRITIN 108  TIBC 210*  IRON 67   Urinalysis    Component Value Date/Time   COLORURINE AMBER (A) 02/16/2020 1130   APPEARANCEUR CLOUDY (A) 02/16/2020 1130   LABSPEC 1.019 02/16/2020 1130   PHURINE 6.0 02/16/2020 1130   GLUCOSEU NEGATIVE 02/16/2020 1130   HGBUR SMALL (A) 02/16/2020 1130   BILIRUBINUR NEGATIVE 02/16/2020 1130   KETONESUR NEGATIVE 02/16/2020 1130   PROTEINUR 100 (A) 02/16/2020 1130   NITRITE NEGATIVE 02/16/2020 1130   LEUKOCYTESUR LARGE (A) 02/16/2020 1130   Sepsis Labs Invalid input(s): PROCALCITONIN,  WBC,  LACTICIDVEN Microbiology Recent Results (from the past 240 hour(s))  SARS Coronavirus 2 by RT PCR (hospital order, performed in [facility] hospital lab) Nasopharyngeal Nasopharyngeal Swab     Status: None   Collection Time: 02/16/20 10:39 AM   Specimen: Nasopharyngeal Swab  Result Value Ref Range Status   SARS Coronavirus 2 NEGATIVE NEGATIVE Final    Comment:  (NOTE) SARS-CoV-2 target nucleic acids are NOT DETECTED.  The SARS-CoV-2 RNA is generally detectable in upper and lower respiratory specimens during the acute phase of infection. The lowest concentration of SARS-CoV-2 viral copies this assay can detect is 250 copies / mL. A negative result does not preclude SARS-CoV-2 infection and should not be used as the sole basis for treatment or other patient management decisions.  A negative result may occur with improper specimen collection / handling, submission of specimen other than nasopharyngeal swab, presence of viral mutation(s) within the areas targeted by this assay, and inadequate number of viral copies (<250 copies / mL). A negative result must be combined with clinical observations, patient history, and epidemiological information.  Fact Sheet for Patients:   BoilerBrush.com.cy  Fact Sheet for Healthcare Providers: https://pope.com/  This test is not yet approved or  cleared by the [place] FDA and has been authorized for detection and/or diagnosis of SARS-CoV-2 by FDA under an Emergency Use Authorization (EUA).  This EUA will remain in effect (meaning this test can be used) for the duration of the COVID-19 declaration under Section 564(b)(1) of the Act, 21 U.S.C. section 360bbb-3(b)(1), unless the authorization is terminated or revoked sooner.  Performed at [facility], [address], [place], [place] [address]   Urine culture     Status: Abnormal   Collection Time: 02/16/20 12:42 PM   Specimen: Urine, Random  Result Value Ref Range Status   Specimen Description URINE, RANDOM  Final   Special Requests   Final    NONE Performed at [facility], [address] [place], [place] [address]    Culture >=100,000 COLONIES/mL ESCHERICHIA COLI (A)  Final   Report Status 02/18/2020 FINAL  Final   Organism ID, Bacteria ESCHERICHIA COLI (A)  Final       Susceptibility   Escherichia coli - MIC*    AMPICILLIN 4 SENSITIVE Sensitive     CEFAZOLIN <=4 SENSITIVE Sensitive  CEFTRIAXONE <=0.25 SENSITIVE Sensitive     CIPROFLOXACIN <=0.25 SENSITIVE Sensitive     GENTAMICIN <=1 SENSITIVE Sensitive     IMIPENEM <=0.25 SENSITIVE Sensitive     NITROFURANTOIN <=16 SENSITIVE Sensitive     TRIMETH/SULFA <=20 SENSITIVE Sensitive     AMPICILLIN/SULBACTAM <=2 SENSITIVE Sensitive     PIP/TAZO <=4 SENSITIVE Sensitive     * >=100,000 COLONIES/mL ESCHERICHIA COLI  Culture, blood (Routine X 2) w Reflex to ID Panel     Status: None   Collection Time: 02/16/20  1:30 PM   Specimen: BLOOD RIGHT HAND  Result Value Ref Range Status   Specimen Description BLOOD RIGHT HAND  Final   Special Requests   Final    BOTTLES DRAWN AEROBIC ONLY Blood Culture results may not be optimal due to an inadequate volume of blood received in culture bottles   Culture   Final    NO GROWTH 5 DAYS Performed at [facility], [address] [place], [place] [address]    Report Status 02/21/2020 FINAL  Final  Culture, blood (Routine X 2) w Reflex to ID Panel     Status: None   Collection Time: 02/16/20  1:41 PM   Specimen: BLOOD RIGHT HAND  Result Value Ref Range Status   Specimen Description BLOOD RIGHT HAND  Final   Special Requests   Final    BOTTLES DRAWN AEROBIC AND ANAEROBIC Blood Culture results may not be optimal due to an inadequate volume of blood received in culture bottles   Culture   Final    NO GROWTH 5 DAYS Performed at [facility], [address], [place], [place] [address]    Report Status 02/21/2020 FINAL  Final  MRSA PCR Screening     Status: Abnormal   Collection Time: 02/16/20  9:40 PM   Specimen: Nasopharyngeal  Result Value Ref Range Status   MRSA by PCR POSITIVE (A) NEGATIVE Final    Comment:        The GeneXpert MRSA Assay (FDA approved for NASAL specimens only), is one component of a comprehensive MRSA colonization surveillance  program. It is not intended to diagnose MRSA infection nor to guide or monitor treatment for MRSA infections. RESULT CALLED TO, READ BACK BY AND VERIFIED WITH: R PROCTER RN 02/16/20 AT 2342 SK Performed at [facility], [address] [place], [place] [address]   SARS CORONAVIRUS 2 (TAT 6-24 HRS) Nasopharyngeal Nasopharyngeal Swab     Status: None   Collection Time: 02/22/20  2:04 PM   Specimen: Nasopharyngeal Swab  Result Value Ref Range Status   SARS Coronavirus 2 NEGATIVE NEGATIVE Final    Comment: (NOTE) SARS-CoV-2 target nucleic acids are NOT DETECTED.  The SARS-CoV-2 RNA is generally detectable in upper and lower respiratory specimens during the acute phase of infection. Negative results do not preclude SARS-CoV-2 infection, do not rule out co-infections with other pathogens, and should not be used as the sole basis for treatment or other patient management decisions. Negative results must be combined with clinical observations, patient history, and epidemiological information. The expected result is Negative.  Fact Sheet for Patients: HairSlick.no  Fact Sheet for Healthcare Providers: quierodirigir.com  This test is not yet approved or cleared by the [place] FDA and  has been authorized for detection and/or diagnosis of SARS-CoV-2 by FDA under an Emergency Use Authorization (EUA). This EUA will remain  in effect (meaning this test can be used) for the duration of the COVID-19  declaration under Se ction 564(b)(1) of the Act, 21 U.S.C. section 360bbb-3(b)(1), unless the authorization is terminated or revoked sooner.  Performed at [facility], [address] [place], [place] [address]      Time coordinating discharge: Over 30 minutes  SIGNED:   Laverna Peace, MD  [facility] 02/24/2020, 8:42 AM Pager   If 7PM-7AM, please contact night-coverage [email or web address] Password [email or web address]

## 2020-02-24 NOTE — Plan of Care (Signed)

## 2020-02-24 NOTE — TOC Transition Note (Addendum)
Transition of Care [facility]) - CM/SW Discharge Note   Patient Details  Name: Victor Fitzgerald MRN: [id number] Date of Birth: 1946/06/03  [facility] of [facility]) CM/SW Contact:  Kermit Balo, RN Phone Number: 02/24/2020, 10:51 AM   Clinical Narrative:    Pt dishcarged to [facility] [place] [place]. Pt will transport via [facility]. Pt without family to notify. Bedside RN updated and d/c packet at the desk.   Room110 Number for report: [phone]   Final next level of care: Home w Home Health Services Barriers to Discharge: Inadequate or no insurance, Barriers Unresolved (comment)   Patient Goals and CMS Choice   CMS Medicare.gov Compare Post Acute Care list provided to:: Patient Choice offered to / list presented to : Patient, Parent  Discharge Placement                       Discharge Plan and Services   Discharge Planning Services: CM Consult            DME Arranged: 3-N-1 DME Agency: [employer] Date DME Agency Contacted: 02/24/20   Representative spoke with at DME Agency: Leeroy Bock HH Arranged: PT, OT HH Agency: [facility] Date [facility] Agency Contacted: 02/23/20   Representative spoke with at [facility] Agency: Cassie  Social Determinants of Health (SDOH) Interventions     Readmission Risk Interventions No flowsheet data found.

## 2020-02-25 LAB — HEPATITIS B DNA, ULTRAQUANTITATIVE, PCR
HBV DNA SERPL PCR-ACNC: NOT DETECTED IU/mL
HBV DNA SERPL PCR-LOG IU: UNDETERMINED log10 IU/mL

## 2021-01-03 IMAGING — CT CT ABD-PELV W/ CM
2 of 5 series · 16 of 46 positions shown, 18 images · IV contrast (Omni 300)
Comparison: Chest radiographs 02/17/2020.

CLINICAL DATA: 73-year-old male with increasing abdominal pain.

EXAM:
CT ABDOMEN AND PELVIS WITH CONTRAST
TECHNIQUE: Multidetector CT imaging of the abdomen and pelvis was performed
using the standard protocol following bolus administration of
intravenous contrast.
CONTRAST:  100mL OMNIPAQUE IOHEXOL 300 MG/ML  SOLN

[Series 3: a/p w/ 5mm · axial · 0.95mm/px · z∈[+807,+1202]mm · 13 of 89 slices shown, 15 images]
[im 5/89  soft-tissue]
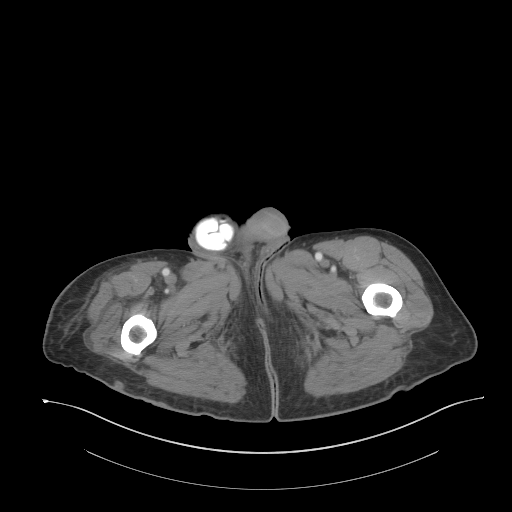
[im 5/89  bone]
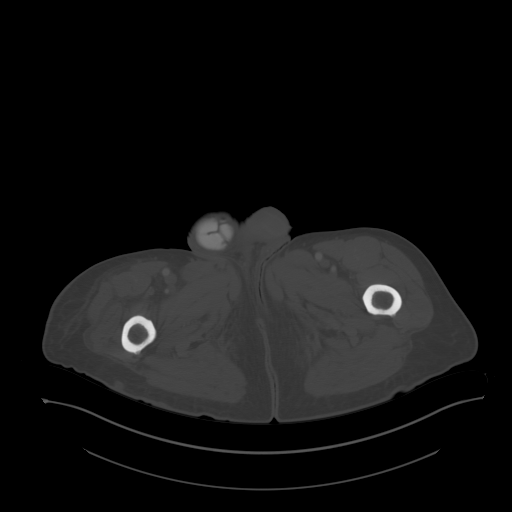
[im 10/89  soft-tissue]
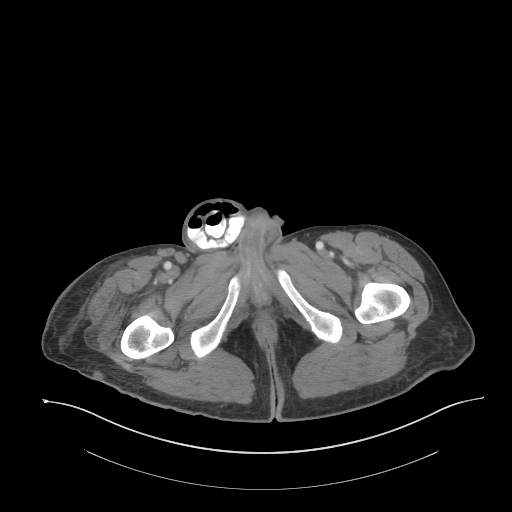
[im 20/89  soft-tissue]
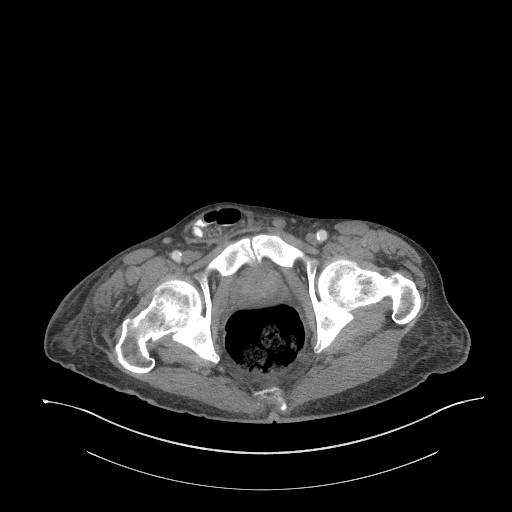
[im 25/89  soft-tissue]
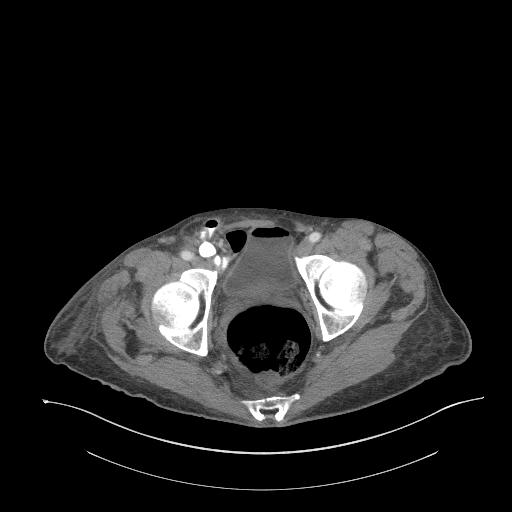
[im 30/89  soft-tissue]
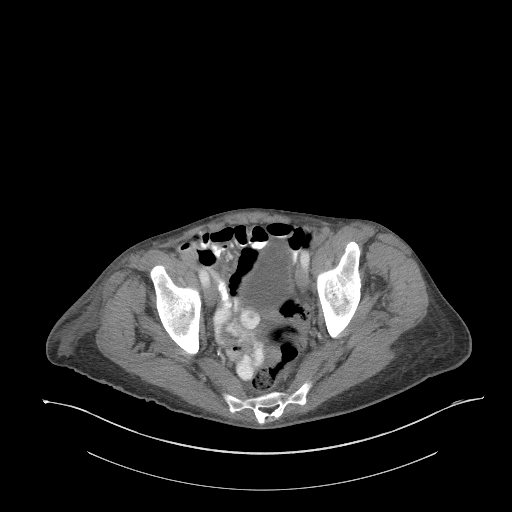
[im 40/89  soft-tissue]
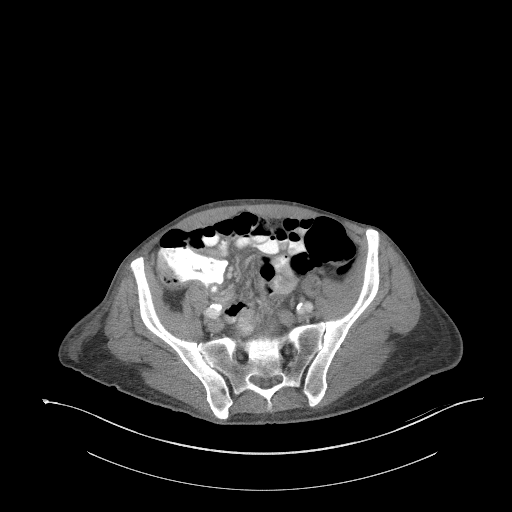
[im 45/89  soft-tissue]
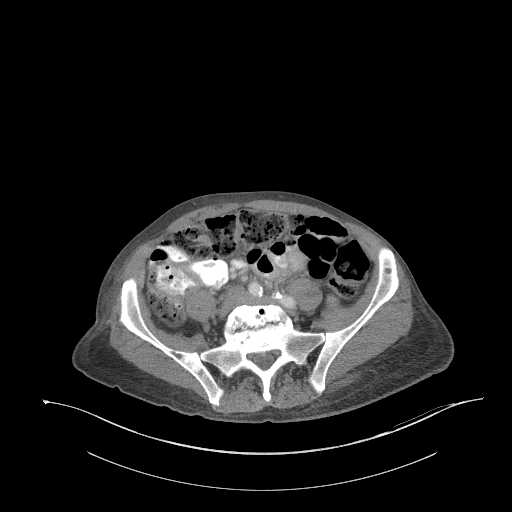
[im 49/89  soft-tissue]
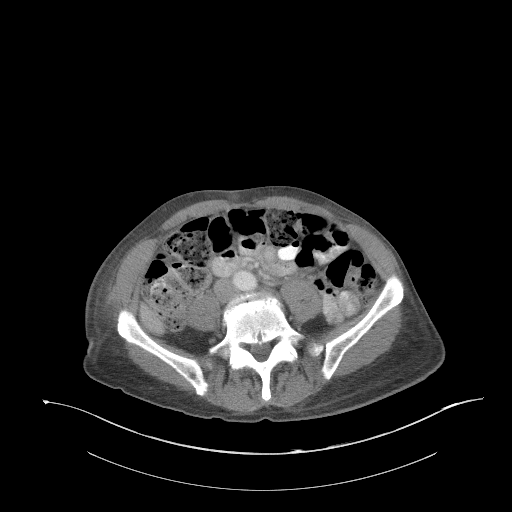
[im 59/89  soft-tissue]
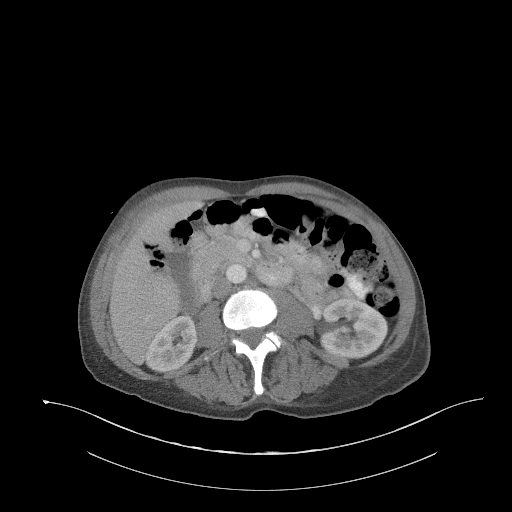
[im 59/89  bone]
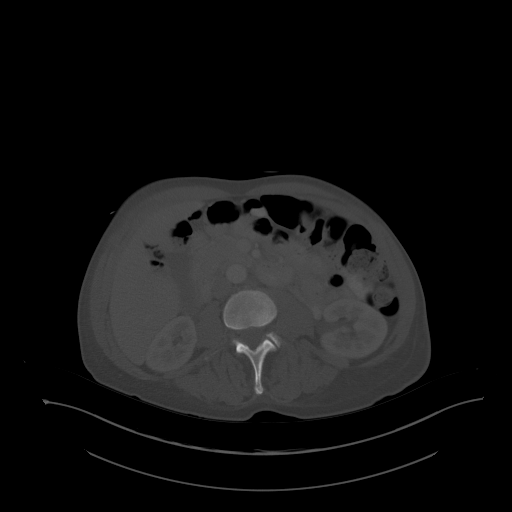
[im 64/89  soft-tissue]
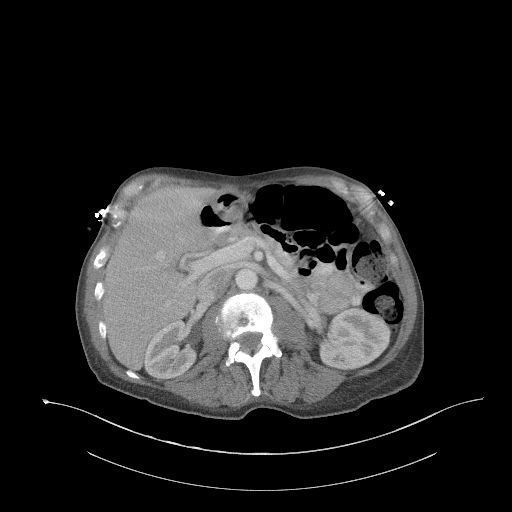
[im 69/89  soft-tissue]
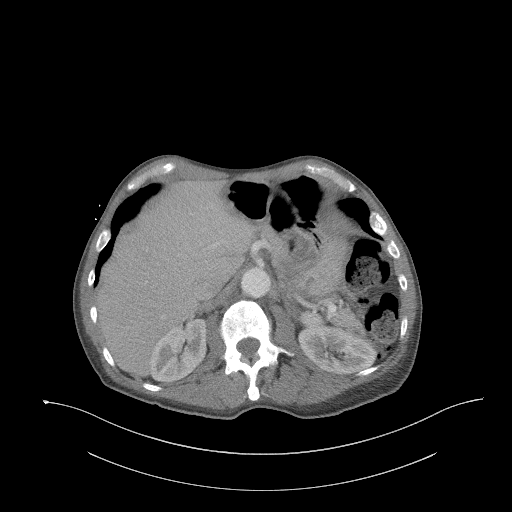
[im 79/89  soft-tissue]
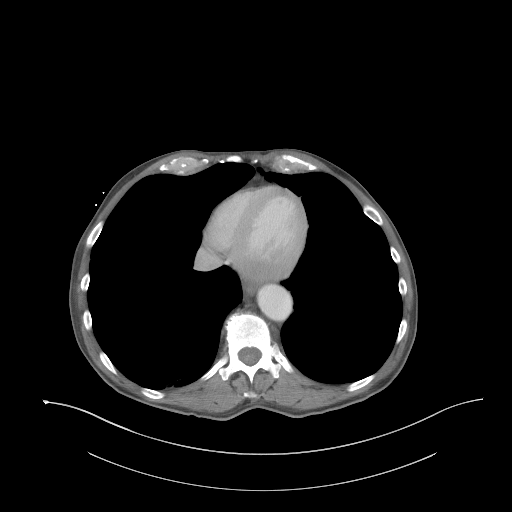
[im 84/89  soft-tissue]
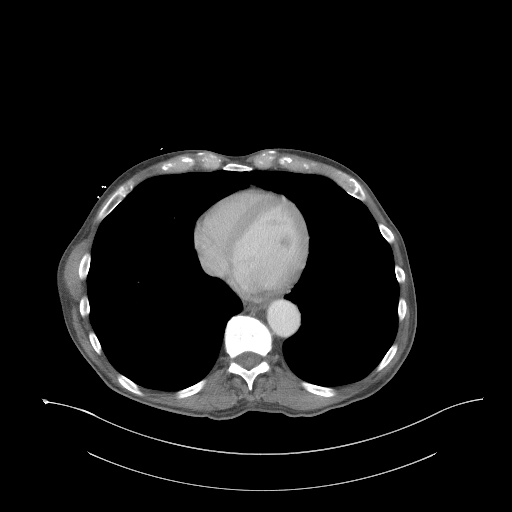

[Series 6: a/p w/ cor · coronal · 0.87mm/px · 3 of 151 slices shown]
[im 51/151  soft-tissue]
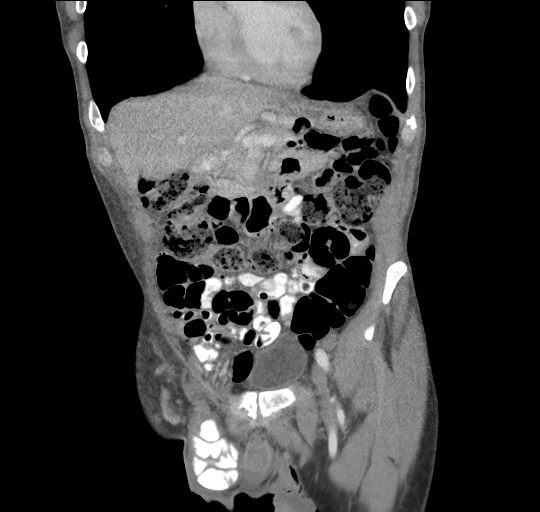
[im 67/151  soft-tissue]
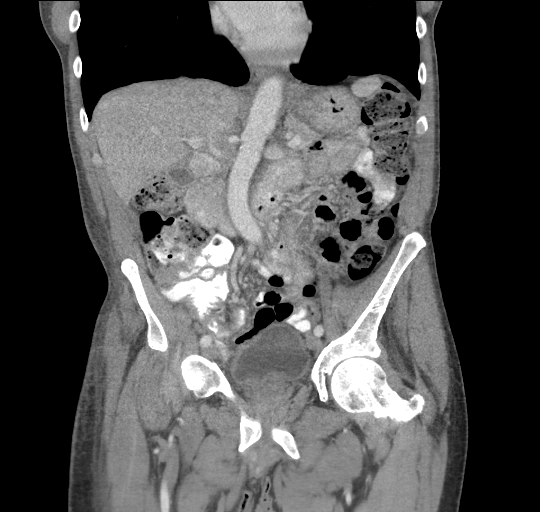
[im 84/151  soft-tissue]
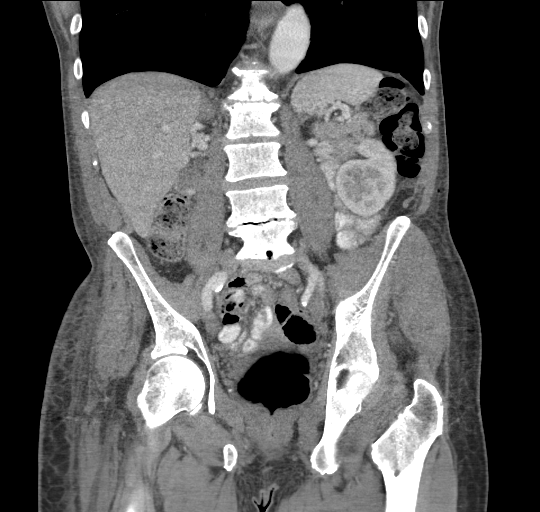

[16 of 46 positions shown; findings below may reference images not displayed]

FINDINGS: Lower chest: Mild lung base bronchiectasis. Mild associated lower
lobe posterior basal segment scarring. No airspace disease. No
pericardial or pleural effusion.

Hepatobiliary: Negative liver and gallbladder.

Pancreas: Negative.

Spleen: Negative.

Adrenals/Urinary Tract: Normal adrenal glands. Bilateral renal
enhancement and contrast excretion is symmetric and within normal
limits. Decompressed proximal ureters. No nephrolithiasis or
discrete renal lesion.

The urinary bladder is relatively decompressed but contains a
moderate amount of gas (series 3, image 65). Mild circumferential
bladder wall thickening. No perivesical stranding.

Stomach/Bowel: Retained gas and stool in the rectum. Decompressed
sigmoid colon. Similar gas and stool throughout the descending and
redundant transverse colon. Oral contrast has reached the cecum and
proximal ascending colon, mixed with stool there. Negative terminal
ileum. And evidence of a normal gas containing appendix on series 3,
image 51.

There is an 11 cm right inguinal hernia containing multiple
nondilated and contrast containing small bowel loops (coronal image
45. No dilated loops upstream or downstream of the hernia. No
definite fluid or inflammatory stranding within the hernia sac.

Largely decompressed stomach and duodenum. No free air. No free
fluid identified.

Vascular/Lymphatic: Major arterial structures are patent. Calcified
iliac artery atherosclerosis. Portal venous system is patent. No
lymphadenopathy.

Reproductive: Bowel containing right inguinal hernia as detailed
above. Otherwise negative.

Other: No definite pelvic free fluid.

Musculoskeletal: Advanced degenerative changes in the lumbar spine
including grade 1 spondylolisthesis measuring up to 10 mm at L4-L5.
Associated chronic L4 pars fractures. Heterogeneous bone
mineralization in the lower spine and pelvis, but no destructive
osseous lesion identified.
IMPRESSION: 1. Positive for 11 cm right inguinal hernia containing multiple
small bowel loops, but no evidence of bowel obstruction or
inflammation at this time.

2. Gas within the urinary bladder, suspicious for UTI unless
explained by recent catheterization. Mild circumferential bladder
wall thickening also noted.

3. No other acute or inflammatory process identified in the abdomen
or pelvis.
Redundant large bowel with retained stool.
Lung base Bronchiectasis and mild pulmonary scarring.
Advanced lumbar spine degeneration with chronic L4 pars fractures
and grade 1 spondylolisthesis at L4-L5.

## 2021-01-07 IMAGING — DX DG CHEST 1V PORT
1 series · 1 of 1 positions shown · non-contrast
Comparison: 07/30/2013 chest radiograph.

CLINICAL DATA: Patient with shortness of breath and difficulty
breathing. States allergic to shrimp. Patient recently consumed
shrimp.

EXAM:
PORTABLE CHEST 1 VIEW

[chest ap]
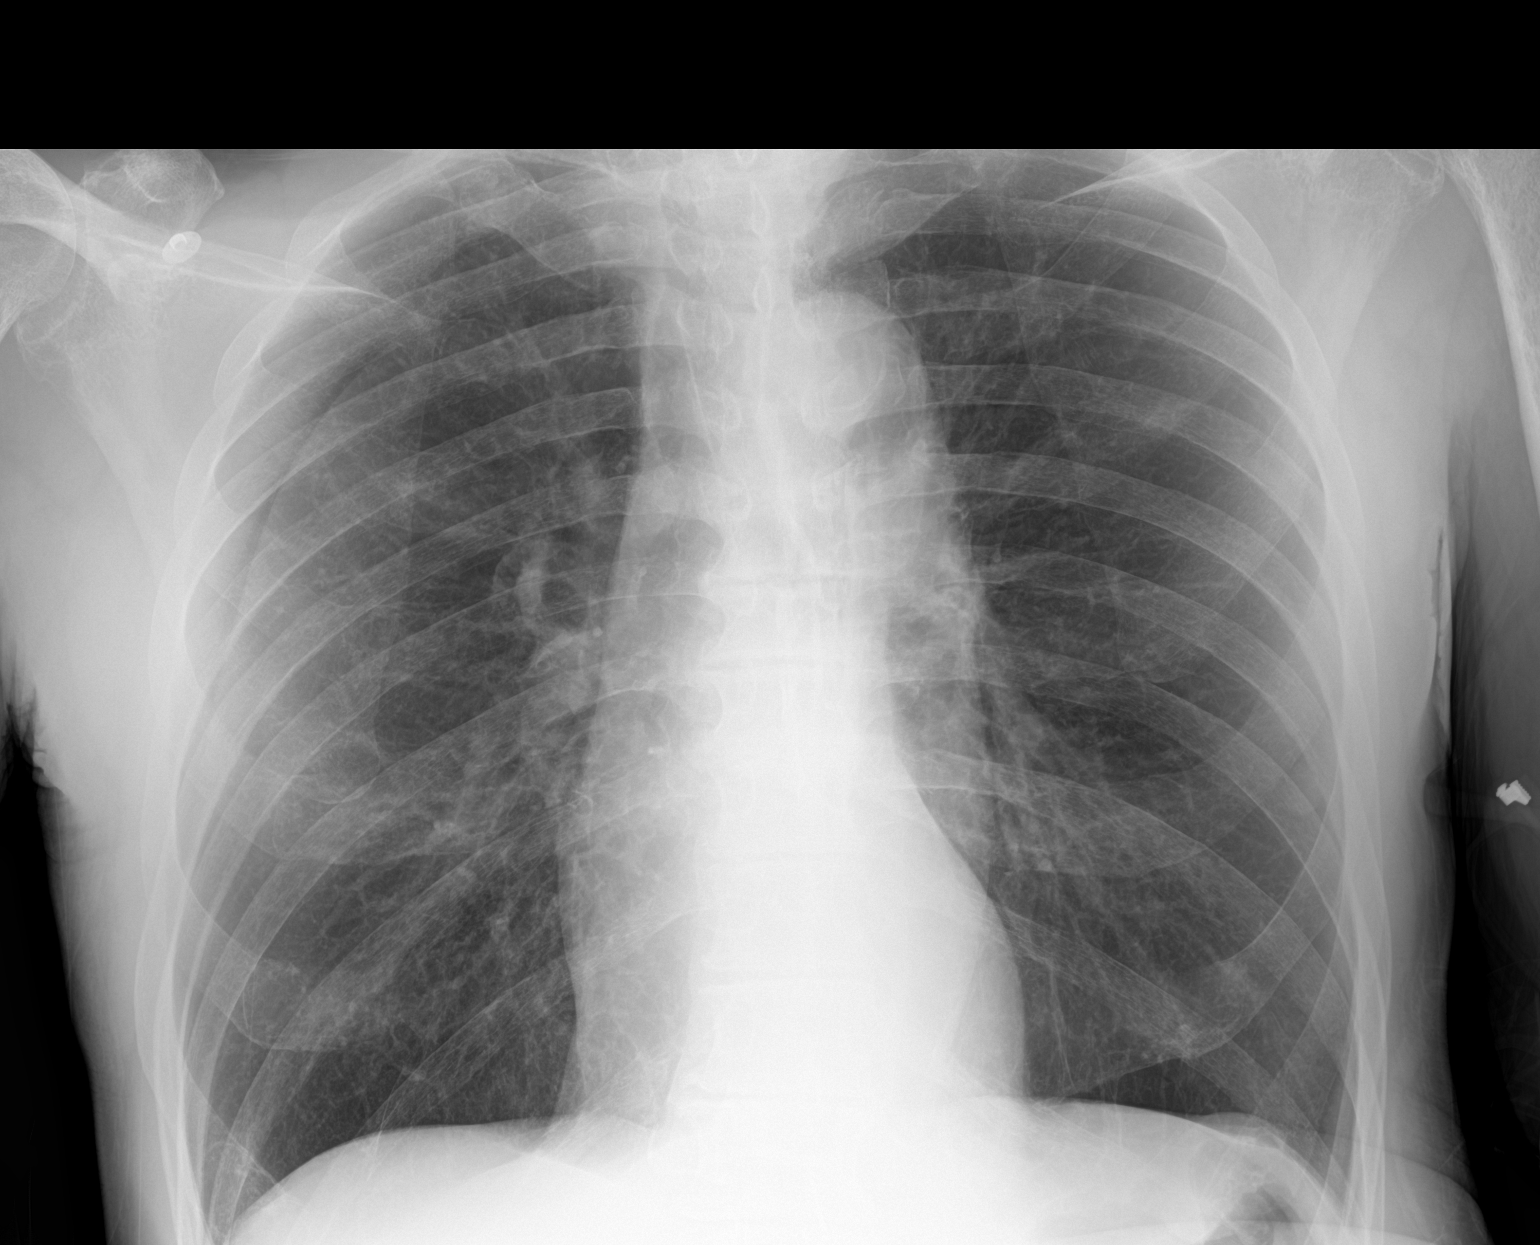

[1 of 1 positions shown; findings below may reference images not displayed]

FINDINGS: Stable cardiac and mediastinal contours. Mild tortuosity of the
thoracic aorta. No large area pulmonary consolidation. No pleural
effusion or pneumothorax. Suspect skin fold overlying the right
hemithorax. Thoracic spine degenerative changes.
IMPRESSION: No acute cardiopulmonary process.
